# Patient Record
Sex: Female | Born: 1985 | Race: Black or African American | Hispanic: No | Marital: Married | Smoking: Never smoker
Health system: Southern US, Community
[De-identification: ages and names within clinical notes are randomized; demographics above are authoritative.]

## PROBLEM LIST (undated history)

## (undated) DIAGNOSIS — D573 Sickle-cell trait: Secondary | ICD-10-CM

## (undated) DIAGNOSIS — Z809 Family history of malignant neoplasm, unspecified: Secondary | ICD-10-CM

## (undated) DIAGNOSIS — IMO0002 Reserved for concepts with insufficient information to code with codable children: Secondary | ICD-10-CM

## (undated) DIAGNOSIS — Z975 Presence of (intrauterine) contraceptive device: Secondary | ICD-10-CM

## (undated) DIAGNOSIS — B379 Candidiasis, unspecified: Secondary | ICD-10-CM

## (undated) DIAGNOSIS — Z832 Family history of diseases of the blood and blood-forming organs and certain disorders involving the immune mechanism: Secondary | ICD-10-CM

## (undated) DIAGNOSIS — D219 Benign neoplasm of connective and other soft tissue, unspecified: Secondary | ICD-10-CM

## (undated) DIAGNOSIS — Z8619 Personal history of other infectious and parasitic diseases: Secondary | ICD-10-CM

## (undated) DIAGNOSIS — T7840XA Allergy, unspecified, initial encounter: Secondary | ICD-10-CM

## (undated) DIAGNOSIS — A749 Chlamydial infection, unspecified: Secondary | ICD-10-CM

## (undated) HISTORY — DX: Family history of malignant neoplasm, unspecified: Z80.9

## (undated) HISTORY — DX: Personal history of other infectious and parasitic diseases: Z86.19

## (undated) HISTORY — DX: Family history of diseases of the blood and blood-forming organs and certain disorders involving the immune mechanism: Z83.2

## (undated) HISTORY — DX: Sickle-cell trait: D57.3

## (undated) HISTORY — DX: Candidiasis, unspecified: B37.9

## (undated) HISTORY — PX: PULMONARY EMBOLISM SURGERY: SHX752

## (undated) HISTORY — DX: Benign neoplasm of connective and other soft tissue, unspecified: D21.9

## (undated) HISTORY — DX: Reserved for concepts with insufficient information to code with codable children: IMO0002

## (undated) HISTORY — DX: Presence of (intrauterine) contraceptive device: Z97.5

## (undated) HISTORY — PX: NO PAST SURGERIES: SHX2092

## (undated) HISTORY — DX: Allergy, unspecified, initial encounter: T78.40XA

## (undated) HISTORY — DX: Chlamydial infection, unspecified: A74.9

## (undated) HISTORY — PX: WISDOM TOOTH EXTRACTION: SHX21

---
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
## 2010-08-12 DIAGNOSIS — Z975 Presence of (intrauterine) contraceptive device: Secondary | ICD-10-CM | POA: Insufficient documentation

## 2010-08-12 HISTORY — DX: Presence of (intrauterine) contraceptive device: Z97.5

## 2011-08-13 DIAGNOSIS — A749 Chlamydial infection, unspecified: Secondary | ICD-10-CM

## 2011-08-13 HISTORY — DX: Chlamydial infection, unspecified: A74.9

## 2011-12-20 ENCOUNTER — Ambulatory Visit (INDEPENDENT_AMBULATORY_CARE_PROVIDER_SITE_OTHER): Payer: No Typology Code available for payment source | Admitting: Obstetrics and Gynecology

## 2011-12-20 ENCOUNTER — Encounter: Payer: Self-pay | Admitting: Obstetrics and Gynecology

## 2011-12-20 ENCOUNTER — Other Ambulatory Visit: Payer: Self-pay | Admitting: Obstetrics and Gynecology

## 2011-12-20 VITALS — BP 100/68 | HR 86 | Ht 63.0 in | Wt 138.0 lb

## 2011-12-20 DIAGNOSIS — Z30431 Encounter for routine checking of intrauterine contraceptive device: Secondary | ICD-10-CM

## 2011-12-20 DIAGNOSIS — A499 Bacterial infection, unspecified: Secondary | ICD-10-CM

## 2011-12-20 DIAGNOSIS — E010 Iodine-deficiency related diffuse (endemic) goiter: Secondary | ICD-10-CM

## 2011-12-20 DIAGNOSIS — N898 Other specified noninflammatory disorders of vagina: Secondary | ICD-10-CM

## 2011-12-20 DIAGNOSIS — N760 Acute vaginitis: Secondary | ICD-10-CM

## 2011-12-20 DIAGNOSIS — Z124 Encounter for screening for malignant neoplasm of cervix: Secondary | ICD-10-CM

## 2011-12-20 DIAGNOSIS — Z113 Encounter for screening for infections with a predominantly sexual mode of transmission: Secondary | ICD-10-CM

## 2011-12-20 DIAGNOSIS — E049 Nontoxic goiter, unspecified: Secondary | ICD-10-CM

## 2011-12-20 LAB — HEPATITIS C ANTIBODY: HCV Ab: NEGATIVE

## 2011-12-20 LAB — POCT WET PREP (WET MOUNT)
Clue Cells Wet Prep Whiff POC: POSITIVE
pH: 5.5

## 2011-12-20 LAB — HEPATITIS B SURFACE ANTIGEN: Hepatitis B Surface Ag: NEGATIVE

## 2011-12-20 MED ORDER — TINIDAZOLE 500 MG PO TABS: 2.0000 g | ORAL_TABLET | Freq: Every day | ORAL | Status: AC

## 2011-12-20 NOTE — Progress Notes (Addendum)
Last Pap: 2012 per pt  WNL: Yes Regular Periods:no Contraception: mirena  Monthly Breast exam:no Tetanus<73yrs:yes Nl.Bladder Function:yes Daily BMs:yes Healthy Diet:no Calcium:yes Mammogram:no Exercise:no Seatbelt: yes Abuse at home: no Stressful work:no Sigmoid-colonoscopy: n/a  Bone Density: No Pt wants to make sure she does not have a yeast infection. Kim Alvarez  C/o cramping wonders if IUD is displaced.  Also c/o vag irritation.  Filed Vitals:   12/20/11 1037  BP: 100/68  Pulse: 86   ROS: noncontributory  Physical Examination: General appearance - alert, well appearing, and in no distress Neck - supple, thyroid mildly enlarged (R>L) Chest - clear to auscultation, no wheezes, rales or rhonchi, symmetric air entry Heart - normal rate and regular rhythm Abdomen - soft, nontender, nondistended, no masses or organomegaly Breasts - breasts appear normal, no suspicious masses, no skin or nipple changes or axillary nodes Pelvic - normal external genitalia, vulva, vagina, cervix, uterus and adnexa, IUD string visible Back exam - no CVAT Extremities - no edema, redness or tenderness in the calves or thighs  Results for orders placed in visit on 12/20/11  POCT WET PREP (WET MOUNT)      Component Value Range   Source Wet Prep POC vaginal     WBC, Wet Prep HPF POC       Bacteria Wet Prep HPF POC mod     BACTERIA WET PREP MORPHOLOGY POC       Clue Cells Wet Prep HPF POC Moderate     CLUE CELLS WET PREP WHIFF POC Positive Whiff     Yeast Wet Prep HPF POC None     KOH Wet Prep POC       Trichomonas Wet Prep HPF POC none     pH 5.5     A/P sched u/s to eval IUD next available STD testing w/ consent Tindamax for BV Add on TSH to labs and sched thyroid u/s

## 2011-12-20 NOTE — Progress Notes (Signed)
Addended by: Osborn Coho on: 12/20/2011 05:22 PM   Modules accepted: Orders

## 2011-12-23 LAB — HSV 1 ANTIBODY, IGG: HSV 1 Glycoprotein G Ab, IgG: <0.1 IV

## 2011-12-23 LAB — TSH: TSH: 0.399 u[IU]/mL (ref 0.350–4.500)

## 2011-12-25 ENCOUNTER — Telehealth: Payer: Self-pay

## 2011-12-25 LAB — PAP IG, CT-NG, RFX HPV ASCU
Chlamydia Probe Amp: NEGATIVE
GC Probe Amp: NEGATIVE

## 2011-12-25 NOTE — Telephone Encounter (Signed)
LM for pt that thyroid u/s is scheduled for 12/27/2011 @ 2 pm. @ [address] in the [facility]. I asked pt to cb to let me know she got my message. Melody Comas A

## 2011-12-27 ENCOUNTER — Ambulatory Visit
Admission: RE | Admit: 2011-12-27 | Discharge: 2011-12-27 | Disposition: A | Discharge status: Home / Self Care | Payer: PRIVATE HEALTH INSURANCE | Source: Ambulatory Visit | Attending: Obstetrics and Gynecology | Admitting: Obstetrics and Gynecology

## 2011-12-27 DIAGNOSIS — E010 Iodine-deficiency related diffuse (endemic) goiter: Secondary | ICD-10-CM

## 2012-01-02 ENCOUNTER — Telehealth: Payer: Self-pay

## 2012-01-02 NOTE — Telephone Encounter (Signed)
Spoke to pt and notified of thyroid u/s results and the need for endocrinology referral. I will call her w/ appt date and time. Appt set w/ Dr. Chestine Spore for Wed. 01/08/2012 @ 1 pm. Records faxed. Melody Comas A

## 2012-01-02 NOTE — Telephone Encounter (Signed)
Appt set for 01/08/2012 w/ Dr. Chestine Spore @ 1 pm. Pt is aware. Melody Comas A

## 2012-01-03 ENCOUNTER — Encounter: Payer: No Typology Code available for payment source | Admitting: Obstetrics and Gynecology

## 2012-01-03 ENCOUNTER — Other Ambulatory Visit: Payer: No Typology Code available for payment source

## 2012-01-16 ENCOUNTER — Encounter: Payer: No Typology Code available for payment source | Admitting: Obstetrics and Gynecology

## 2012-01-16 ENCOUNTER — Other Ambulatory Visit: Payer: No Typology Code available for payment source

## 2012-01-28 ENCOUNTER — Other Ambulatory Visit: Payer: Self-pay | Admitting: Internal Medicine

## 2012-01-28 DIAGNOSIS — E042 Nontoxic multinodular goiter: Secondary | ICD-10-CM

## 2012-02-03 ENCOUNTER — Encounter: Payer: No Typology Code available for payment source | Admitting: Obstetrics and Gynecology

## 2012-02-04 ENCOUNTER — Encounter (HOSPITAL_COMMUNITY)
Admission: RE | Admit: 2012-02-04 | Discharge: 2012-02-04 | Disposition: A | Discharge status: Home / Self Care | Payer: PRIVATE HEALTH INSURANCE | Source: Ambulatory Visit | Attending: Internal Medicine | Admitting: Internal Medicine

## 2012-02-04 DIAGNOSIS — E042 Nontoxic multinodular goiter: Secondary | ICD-10-CM | POA: Insufficient documentation

## 2012-02-05 ENCOUNTER — Encounter (HOSPITAL_COMMUNITY)
Admission: RE | Admit: 2012-02-05 | Discharge: 2012-02-05 | Disposition: A | Discharge status: Home / Self Care | Payer: PRIVATE HEALTH INSURANCE | Source: Ambulatory Visit | Attending: Internal Medicine | Admitting: Internal Medicine

## 2012-02-05 MED ORDER — SODIUM PERTECHNETATE TC 99M INJECTION
10.0000 | Freq: Once | INTRAVENOUS | Status: AC | PRN
  Administered 2012-02-05: 10 via INTRAVENOUS

## 2012-02-05 MED ORDER — SODIUM IODIDE I 131 CAPSULE
9.3000 | Freq: Once | INTRAVENOUS | Status: AC | PRN
  Administered 2012-02-05: 9.3 via ORAL

## 2012-02-20 ENCOUNTER — Ambulatory Visit (INDEPENDENT_AMBULATORY_CARE_PROVIDER_SITE_OTHER): Payer: No Typology Code available for payment source | Admitting: Obstetrics and Gynecology

## 2012-02-20 ENCOUNTER — Encounter: Payer: Self-pay | Admitting: Obstetrics and Gynecology

## 2012-02-20 ENCOUNTER — Telehealth: Payer: Self-pay | Admitting: Obstetrics and Gynecology

## 2012-02-20 VITALS — BP 100/60 | Temp 98.0°F | Ht 64.0 in | Wt 134.0 lb

## 2012-02-20 DIAGNOSIS — D259 Leiomyoma of uterus, unspecified: Secondary | ICD-10-CM

## 2012-02-20 DIAGNOSIS — R87619 Unspecified abnormal cytological findings in specimens from cervix uteri: Secondary | ICD-10-CM | POA: Insufficient documentation

## 2012-02-20 DIAGNOSIS — D219 Benign neoplasm of connective and other soft tissue, unspecified: Secondary | ICD-10-CM | POA: Insufficient documentation

## 2012-02-20 DIAGNOSIS — IMO0002 Reserved for concepts with insufficient information to code with codable children: Secondary | ICD-10-CM | POA: Insufficient documentation

## 2012-02-20 DIAGNOSIS — B379 Candidiasis, unspecified: Secondary | ICD-10-CM | POA: Insufficient documentation

## 2012-02-20 DIAGNOSIS — R6889 Other general symptoms and signs: Secondary | ICD-10-CM

## 2012-02-20 DIAGNOSIS — B49 Unspecified mycosis: Secondary | ICD-10-CM

## 2012-02-20 LAB — POCT WET PREP (WET MOUNT)
Whiff Test: NEGATIVE
pH: 4.5

## 2012-02-20 MED ORDER — FLUCONAZOLE 150 MG PO TABS: 150.0000 mg | ORAL_TABLET | Freq: Once | ORAL | Status: DC

## 2012-02-20 MED ORDER — TERCONAZOLE 0.4 % VA CREA: 1.0000 | TOPICAL_CREAM | Freq: Every day | VAGINAL | Status: AC

## 2012-02-20 NOTE — Telephone Encounter (Signed)
LM FOR PT TO CALL BACK. 

## 2012-02-20 NOTE — Telephone Encounter (Signed)
TC TO PT REGARDING MESSAGE. PT STATES THAT SHE WOKE UP AT 5:00 AM AND NOTICED A BULGE ON HER LABIA. ? IF IT IS A CYST. SCHEDULE  APPT WITH EP ON TODAY AT 1:30 PM FOR EVAL.  PT VOICED UNDERSTANDING.

## 2012-02-20 NOTE — Progress Notes (Signed)
Color: WHITE Odor: no Itching:no Thin:no Thick:yes Fever:no Dyspareunia:no Hx PID:no HX STD:yes Pelvic Pain:no Desires Gc/CT:no Desires HIV,RPR,HbsAG:no

## 2012-02-20 NOTE — Progress Notes (Signed)
25 YO complains of left vaginal lump since a.m.  Denies pain but it is irritating when she walks.  Has never had this before.  Also complains of vaginal itching.   O: Pelvic: EGBUS-wnl (patient looked at area and was not able to see the swollen area she mentioned), vagina-large amount of clumpy yellow discharge,     cervix-normal, uterus-normal, adnexae-normal     Wet Prep: pH- 4.5, whiff-negative,  large yeast   A: Candida Vaginitis  P: Diflucan 150 mg #1 1 po stat          RTO- AEx or prn  Abdur Hoglund, PA-C

## 2012-06-15 NOTE — Telephone Encounter (Signed)
Chart note to close encounter. Kim Alvarez A

## 2012-10-11 ENCOUNTER — Encounter (HOSPITAL_COMMUNITY): Payer: Self-pay | Admitting: *Deleted

## 2012-10-11 ENCOUNTER — Emergency Department (INDEPENDENT_AMBULATORY_CARE_PROVIDER_SITE_OTHER)
Admission: EM | Admit: 2012-10-11 | Discharge: 2012-10-11 | Disposition: A | Discharge status: Home / Self Care | Payer: PRIVATE HEALTH INSURANCE | Source: Home / Self Care | Attending: Family Medicine | Admitting: Family Medicine

## 2012-10-11 DIAGNOSIS — R220 Localized swelling, mass and lump, head: Secondary | ICD-10-CM

## 2012-10-11 MED ORDER — DOXYCYCLINE HYCLATE 100 MG PO CAPS: 100.0000 mg | ORAL_CAPSULE | Freq: Two times a day (BID) | ORAL | Status: DC

## 2012-10-11 NOTE — ED Notes (Signed)
Patient complains of large painful lump on right side of neck x 5 days. Denies fever/chills.

## 2012-10-11 NOTE — ED Provider Notes (Signed)
History     [name]: [id number]  Arrival date & time 10/11/12  1155   First MD Initiated Contact with Patient 10/11/12 1220      Chief Complaint  Patient presents with  . Recurrent Skin Infections    (Consider location/radiation/quality/duration/timing/severity/associated sxs/prior treatment) Patient is a 27 y.o. female presenting with abscess. The history is provided by the patient.  Abscess Location:  Head/neck Abscess quality: painful and redness   Red streaking: no   Duration:  5 days Progression:  Worsening Pain details:    Severity:  Mild   Progression:  Worsening Chronicity:  New   Past Medical History  Diagnosis Date  . Abnormal Pap smear 2007  . Yeast infection   . Chlamydia   . Fibroids 2011     Fibroids during pregnancy  . H/O varicella   . Family history of sickle cell trait   . FHx: cancer     History reviewed. No pertinent past surgical history.  Family History  Problem Relation Age of Onset  . Sickle cell anemia Mother   . Sickle cell anemia Maternal Aunt   . Breast cancer Paternal Aunt   . Sickle cell anemia Paternal Aunt   . Sickle cell anemia Maternal Uncle     History  Substance Use Topics  . Smoking status: Never Smoker   . Smokeless tobacco: Never Used  . Alcohol Use: Yes     Comment: socially     OB History   Grav Para Term Preterm Abortions TAB SAB Ect Mult Living   2 1   1     1       Review of Systems  Constitutional: Negative.   HENT: Positive for neck pain.   Skin: Positive for rash.    Allergies  Review of patient's allergies indicates no known allergies.  Home Medications   Current Outpatient Rx  Name  Route  Sig  Dispense  Refill  . doxycycline (VIBRAMYCIN) 100 MG capsule   Oral   Take 1 capsule (100 mg total) by mouth 2 (two) times daily.   20 capsule   0   . levonorgestrel (MIRENA) 20 MCG/24HR IUD   Intrauterine   1 each by Intrauterine route once.           BP 110/76  Pulse 97  Temp(Src) 98.2 F  (36.8 C) (Oral)  Resp 18  SpO2 99%  LMP 09/26/2012  Physical Exam  Nursing note and vitals reviewed. Constitutional: She is oriented to person, place, and time. She appears well-developed and well-nourished. No distress.  Neck: Normal range of motion. Neck supple.  Lymphadenopathy:    She has no cervical adenopathy.  Neurological: She is alert and oriented to person, place, and time.  Skin: Skin is warm and dry. Rash noted.  Pustular erythematous nodule right neck. Nearly draining, approx 4mm in size.    ED Course  Procedures (including critical care time)  Labs Reviewed - No data to display No results found.   1. Lump on neck       MDM          Linna Hoff, MD 10/11/12 1351

## 2014-01-21 ENCOUNTER — Encounter: Payer: Self-pay | Admitting: Gynecology

## 2014-01-26 ENCOUNTER — Encounter: Payer: Self-pay | Admitting: Gynecology

## 2014-01-26 ENCOUNTER — Ambulatory Visit (INDEPENDENT_AMBULATORY_CARE_PROVIDER_SITE_OTHER): Payer: BC Managed Care – PPO | Admitting: Gynecology

## 2014-01-26 VITALS — BP 104/66 | Resp 18 | Ht 64.0 in | Wt 141.0 lb

## 2014-01-26 DIAGNOSIS — D573 Sickle-cell trait: Secondary | ICD-10-CM | POA: Insufficient documentation

## 2014-01-26 DIAGNOSIS — A749 Chlamydial infection, unspecified: Secondary | ICD-10-CM | POA: Insufficient documentation

## 2014-01-26 DIAGNOSIS — Z124 Encounter for screening for malignant neoplasm of cervix: Secondary | ICD-10-CM

## 2014-01-26 DIAGNOSIS — Z975 Presence of (intrauterine) contraceptive device: Secondary | ICD-10-CM

## 2014-01-26 DIAGNOSIS — Z113 Encounter for screening for infections with a predominantly sexual mode of transmission: Secondary | ICD-10-CM

## 2014-01-26 DIAGNOSIS — E042 Nontoxic multinodular goiter: Secondary | ICD-10-CM

## 2014-01-26 DIAGNOSIS — Z01419 Encounter for gynecological examination (general) (routine) without abnormal findings: Secondary | ICD-10-CM

## 2014-01-26 LAB — TSH: TSH: 0.337 u[IU]/mL — AB (ref 0.350–4.500)

## 2014-01-26 NOTE — Progress Notes (Signed)
28 y.o. Single African American female   [phone] here for annual exam. Pt is  currently sexually active.  She reports not using condoms on a regular basis.  First sexual activity at 29 years old, 10 number of lifetime partners.   Pt with mirena placed 08/2010, monthly cycles light. Pt reports thyroid is enlarged and was given medication by Dr. Eligah East but did not take as she had no symptoms, now recently she has noticed her neck is enlarged.  No change in weight, bowels.   Pt interested in STD screening, current partner 51m, intermittent condom use.  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 Pt had a 26w premie in [place], [place], presented FD to hospital. History of fibroids  Patient's last menstrual period was 01/19/2014.          Sexually active: yes  The current method of family planning is IUD.    Exercising: no  The patient does not participate in regular exercise at present. Last pap: 2013- not sure if normal  Abnormal: yes when she was in a teenager  Alcohol: socially Tobacco: no Drugs: no Gardisil: no, completed:    Health Maintenance  Topic Date Due  . Tetanus/tdap  06/17/2006  . Influenza Vaccine  03/12/2014  . Pap Smear  12/20/2014    Family History  Problem Relation Age of Onset  . Sickle cell anemia Mother     alive  . Sickle cell anemia Maternal Aunt     alive  . Breast cancer Paternal Aunt   . Sickle cell anemia Maternal Uncle     alive  . Sickle cell anemia Maternal Aunt     died  . Sickle cell anemia Maternal Uncle     died    Patient Active Problem List   Diagnosis Date Noted  . Sickle cell trait   . Chlamydia   . Abnormal Pap smear   . Yeast infection   . Fibroids   . IUD (intrauterine device) in place 08/12/2010    Past Medical History  Diagnosis Date  . Abnormal Pap smear 2007  . Yeast infection   . Chlamydia 2013    treated  . H/O varicella   . FHx: cancer   . IUD (intrauterine device) in place  08/2010  . Fibroids 2011     Fibroids during pregnancy  . Sickle cell trait   . Family history of sickle cell anemia (Hgb SS) in mother     History reviewed. No pertinent past surgical history.  Allergies: Review of patient's allergies indicates no known allergies.  Current Outpatient Prescriptions  Medication Sig Dispense Refill  . levonorgestrel (MIRENA) 20 MCG/24HR IUD 1 each by Intrauterine route once.       No current facility-administered medications for this visit.    ROS: Pertinent items are noted in HPI.  Exam:    BP 104/66  Resp 18  Ht 5\' 4"  (1.626 m)  Wt 141 lb (63.957 kg)  BMI 24.19 kg/m2  LMP 01/19/2014 Weight change: @WEIGHTCHANGE @ Last 3 height recordings:  Ht Readings from Last 3 Encounters:  01/26/14 5\' 4"  (1.626 m)  02/20/12 5\' 4"  (1.626 m)  12/20/11 5\' 3"  (1.6 m)   General appearance: alert, cooperative and appears stated age Head: Normocephalic, without obvious abnormality, atraumatic Neck: no adenopathy, no carotid bruit, no JVD, supple, symmetrical, trachea midline and  Thyroid: irregular, mobile non-tender Lungs: clear to auscultation bilaterally Breasts: normal appearance, no masses or  tenderness Heart: regular rate and rhythm, S1, S2 normal, no murmur, click, rub or gallop Abdomen: soft, non-tender; bowel sounds normal; no masses,  no organomegaly Extremities: extremities normal, atraumatic, no cyanosis or edema Skin: Skin color, texture, turgor normal. No rashes or lesions Lymph nodes: Cervical, supraclavicular, and axillary nodes normal. no inguinal nodes palpated Neurologic: Grossly normal   Pelvic: External genitalia:  no lesions              Urethra: normal appearing urethra with no masses, tenderness or lesions              Bartholins and Skenes: Bartholin's, Urethra, Skene's normal                 Vagina: normal appearing vagina with normal color and discharge, no lesions              Cervix: normal appearance and IUD string  visualized              Pap taken: yes        Bimanual Exam:  Uterus:  enlarged to 8 week's size, irregular, right 3cm anterior, posterior noted                                      Adnexa:    no masses                                      Rectovaginal: Confirms                                      Anus:  normal sphincter tone, no lesions  1. Routine gynecological examination counseled on breast self exam, STD prevention, feminine hygiene, adequate intake of calcium and vitamin D, diet and exercise return annually or prn Discussed STD prevention, regular condom use.   2. Sickle cell trait  3. IUD (intrauterine device) in place   4. Chlamydia  - N. gonorrhoea and Chlamydia by PCR (IPS)  5. Goiter, nontoxic, multinodular Reviewed prior u/s will get labs and repeat  U/s and refer back to Dr. Carlis Abbott - TSH - US Soft Tissue Head/Neck; Future  6. Screening for cervical cancer  - PAP with Reflex to HPV (IPS)  7. 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 gonorrhoea and Chlamydia by PCR (IPS)      An After Visit Summary was printed and given to the patient.

## 2014-01-27 ENCOUNTER — Telehealth: Payer: Self-pay | Admitting: *Deleted

## 2014-01-27 LAB — RPR

## 2014-01-27 LAB — HIV ANTIBODY (ROUTINE TESTING W REFLEX): HIV 1&2 Ab, 4th Generation: NONREACTIVE

## 2014-01-27 NOTE — Telephone Encounter (Signed)
Message copied by Alfonzo Feller on Thu Jan 27, 2014  2:32 PM ------      Message from: Elveria Rising      Created: Thu Jan 27, 2014 11:10 AM       Inform thyroid mildly overstimulated, should f/u thyroid u/s ------

## 2014-01-27 NOTE — Telephone Encounter (Signed)
Left Message To Call Back  

## 2014-01-27 NOTE — Telephone Encounter (Signed)
Patient notified see result note 

## 2014-01-28 ENCOUNTER — Encounter: Payer: Self-pay | Admitting: Gynecology

## 2014-01-28 ENCOUNTER — Telehealth: Payer: Self-pay | Admitting: *Deleted

## 2014-01-28 ENCOUNTER — Ambulatory Visit (INDEPENDENT_AMBULATORY_CARE_PROVIDER_SITE_OTHER): Payer: BC Managed Care – PPO | Admitting: Gynecology

## 2014-01-28 VITALS — BP 104/70 | HR 88 | Resp 16 | Ht 64.0 in | Wt 140.0 lb

## 2014-01-28 DIAGNOSIS — B9689 Other specified bacterial agents as the cause of diseases classified elsewhere: Secondary | ICD-10-CM

## 2014-01-28 DIAGNOSIS — B379 Candidiasis, unspecified: Secondary | ICD-10-CM

## 2014-01-28 DIAGNOSIS — N72 Inflammatory disease of cervix uteri: Secondary | ICD-10-CM

## 2014-01-28 DIAGNOSIS — N760 Acute vaginitis: Secondary | ICD-10-CM

## 2014-01-28 DIAGNOSIS — A499 Bacterial infection, unspecified: Secondary | ICD-10-CM

## 2014-01-28 DIAGNOSIS — A562 Chlamydial infection of genitourinary tract, unspecified: Secondary | ICD-10-CM

## 2014-01-28 LAB — CBC WITH DIFFERENTIAL/PLATELET
BASOS PCT: 0 % (ref 0–1)
Basophils Absolute: 0 10*3/uL (ref 0.0–0.1)
Eosinophils Absolute: 0.2 10*3/uL (ref 0.0–0.7)
Eosinophils Relative: 4 % (ref 0–5)
HEMATOCRIT: 39.2 % (ref 36.0–46.0)
HEMOGLOBIN: 13.1 g/dL (ref 12.0–15.0)
LYMPHS ABS: 2.2 10*3/uL (ref 0.7–4.0)
LYMPHS PCT: 36 % (ref 12–46)
MCH: 26.7 pg (ref 26.0–34.0)
MCHC: 33.4 g/dL (ref 30.0–36.0)
MCV: 79.8 fL (ref 78.0–100.0)
MONO ABS: 0.5 10*3/uL (ref 0.1–1.0)
MONOS PCT: 8 % (ref 3–12)
NEUTROS ABS: 3.1 10*3/uL (ref 1.7–7.7)
NEUTROS PCT: 52 % (ref 43–77)
Platelets: 264 10*3/uL (ref 150–400)
RBC: 4.91 MIL/uL (ref 3.87–5.11)
RDW: 13.6 % (ref 11.5–15.5)
WBC: 6 10*3/uL (ref 4.0–10.5)

## 2014-01-28 LAB — IPS N GONORRHOEA AND CHLAMYDIA BY PCR

## 2014-01-28 LAB — IPS PAP TEST WITH REFLEX TO HPV

## 2014-01-28 MED ORDER — TINIDAZOLE 500 MG PO TABS: 2.0000 g | ORAL_TABLET | Freq: Once | ORAL | Status: DC

## 2014-01-28 MED ORDER — CEFTRIAXONE SODIUM 250 MG IJ SOLR
250.0000 mg | Freq: Once | INTRAMUSCULAR | Status: AC
Start: 2014-01-28 — End: 2014-01-28
  Administered 2014-01-28: 250 mg via INTRAMUSCULAR

## 2014-01-28 MED ORDER — FLUCONAZOLE 150 MG PO TABS: 150.0000 mg | ORAL_TABLET | Freq: Once | ORAL | Status: DC

## 2014-01-28 MED ORDER — AZITHROMYCIN 500 MG PO TABS: 1000.0000 mg | ORAL_TABLET | Freq: Once | ORAL | Status: DC

## 2014-01-28 NOTE — Telephone Encounter (Signed)
Message copied by Jaymes Graff on Fri Jan 28, 2014 11:57 AM ------      Message from: Elveria Rising      Created: Fri Jan 28, 2014 11:51 AM       pls inform +CTM, had in past but no test of cure done, she has IUD In place, she will need a consult to determine if this is her best form of contraception, in addition she has both BV and yeast on PAP.  I would treat all 3 + rocephin, please see if we can get her in now ------

## 2014-01-28 NOTE — Progress Notes (Signed)
Pt asked to come in to office today for treatment of CTM infection.  In addition, pt's pap reported BV and Yeast.  This is the patients second CTM infection.  Different partner.  She was treated at HD with doxycycline but did not return for test of cure.  Pt believes that her partner was also treated. Pt has IUD in place.  ROS: Review of Systems - General ROS: negative for - chills, fatigue or fever Genito-Urinary ROS: no dysuria, trouble voiding, or hematuria negative for - change in menstrual cycle, change in urinary stream, dysmenorrhea, dyspareunia, dysuria, genital discharge, genital ulcers or pelvic pain  BP 104/70  Pulse 88  Resp 16  Ht 5\' 4"  (1.626 m)  Wt 140 lb (63.504 kg)  BMI 24.02 kg/m2  LMP 01/19/2014 General appearance: alert, cooperative and appears stated age Abdomen: soft, non-tender; bowel sounds normal; no masses,  no organomegaly Lymph nodes: Inguinal adenopathy: negative  Pelvic: External genitalia:  no lesions              Urethra:  normal appearing urethra with no masses, tenderness or lesions              Bartholins and Skenes: normal                 Vagina: normal appearing vagina with normal color and discharge, no lesions              Cervix: normal appearance, IUD strings noted, no CMT                      Bimanual Exam:  Uterus:  Irregular, nontender                                      Adnexa: normal adnexa in size, nontender and no masses  Assessment: +chlamydia infection BV Yeast IUD in place  Plan: Pt treated in past for CTM with doxycycline, unclear if reinfection vs incomplete cure, will treat with zithromax Rocephin now IM tindamax-alcohol precautions given Fluconazole Discussed with pt risks of PID with recurrent infection/persistent infection. Signs and symptoms reviewed with pt for PID Discussed alternative forms of contraception such as implanon as she is not monogamous, importance of routine use of condoms rto 1w for recheck Cbc with  diff  76m spent counseling, >50% face to face

## 2014-01-28 NOTE — Telephone Encounter (Signed)
Call to patient and advised Dr Charlies Constable would like to see her today to discuss results. Patient declines and asks if it is important.  Advised of positive tests results for STD chl, BV and trich and Dr Charlies Constable would like to discuss treatment as well as discuss IUD concerns with positive STD. Patient agreeable to come on in.     Routing to Geanna Divirgilio for final review. Patient agreeable to disposition. Will close encounter

## 2014-02-01 ENCOUNTER — Ambulatory Visit (INDEPENDENT_AMBULATORY_CARE_PROVIDER_SITE_OTHER): Payer: BC Managed Care – PPO | Admitting: Gynecology

## 2014-02-01 ENCOUNTER — Telehealth: Payer: Self-pay | Admitting: Gynecology

## 2014-02-01 VITALS — BP 100/66 | Resp 14 | Ht 64.0 in | Wt 143.0 lb

## 2014-02-01 DIAGNOSIS — A562 Chlamydial infection of genitourinary tract, unspecified: Secondary | ICD-10-CM

## 2014-02-01 NOTE — Telephone Encounter (Signed)
Voicemail confirmed patient mobile #. Left message advising patient that she is scheduled at [facility], [place] on [place], June 24 @11am . Also provided their telephone # [phone] and ours if she has any questions/concerns.

## 2014-02-01 NOTE — Progress Notes (Signed)
Subjective:     Patient ID: Kim Alvarez, female   DOB: November 20, 1985, 28 y.o.   MRN: [id number]  HPI Comments: Pt here for f/uy after diagnosed with +CTM, BV and yeast with IUD in place.  Treated with zithromycin, rocephin, tindamax and fluconazole.  Pt states she tolerated medications well.  Informed that HD was notified and partner needs to be treated.  Pt with prior CTM infection without test of cure, concerns re reinfection vs resistant bacteria discussed.    Review of Systems  Constitutional: Negative for fever, chills, diaphoresis and fatigue.  Genitourinary: Negative for dysuria, vaginal bleeding, vaginal discharge and vaginal pain.       Objective:   Physical Exam  Nursing note and vitals reviewed. Constitutional: She is oriented to person, place, and time. She appears well-developed and well-nourished.  Neurological: She is alert and oriented to person, place, and time.  Skin: Skin is warm and dry.   Pelvic: External genitalia:  no lesions              Urethra:  normal appearing urethra with no masses, tenderness or lesions              Bartholins and Skenes: normal                 Vagina: normal appearing vagina with normal color and discharge, no lesions              Cervix: normal appearance, no discharge, no cervical motion tenderness, iud strings noted                      Bimanual Exam:  Uterus:  uterus is normal size, shape, consistency and nontender                                      Adnexa: normal adnexa in size, nontender and no masses                                        Assessment:     +CTM infection with IUD     Plan:     Labs reviewed, normal cbc with wbc  boyfriend to be treated without testing Reviewed reportable infections Test of cure in 3w Questions addressed

## 2014-02-02 ENCOUNTER — Other Ambulatory Visit: Payer: PRIVATE HEALTH INSURANCE

## 2014-02-14 ENCOUNTER — Inpatient Hospital Stay: Admission: RE | Admit: 2014-02-14 | Payer: PRIVATE HEALTH INSURANCE | Source: Ambulatory Visit

## 2014-04-23 IMAGING — US US SOFT TISSUE HEAD/NECK
1 series · 14 of 25 positions shown · non-contrast
Comparison: None.

CLINICAL DATA: Enlarged thyroid gland.

THYROID ULTRASOUND
TECHNIQUE: Ultrasound examination of the thyroid gland and adjacent
soft tissues was performed.

[Series 1: us soft tissue head/neck · 0.09mm/px · 14 of 80 slices shown]
[im 1/80]
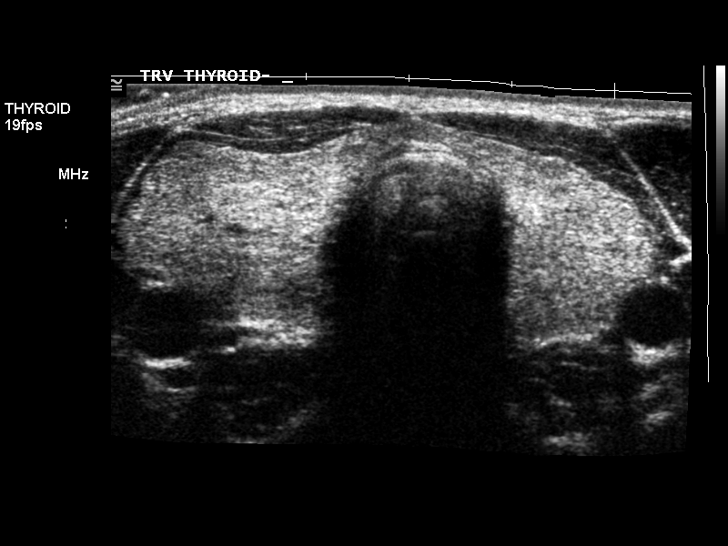
[im 7/80]
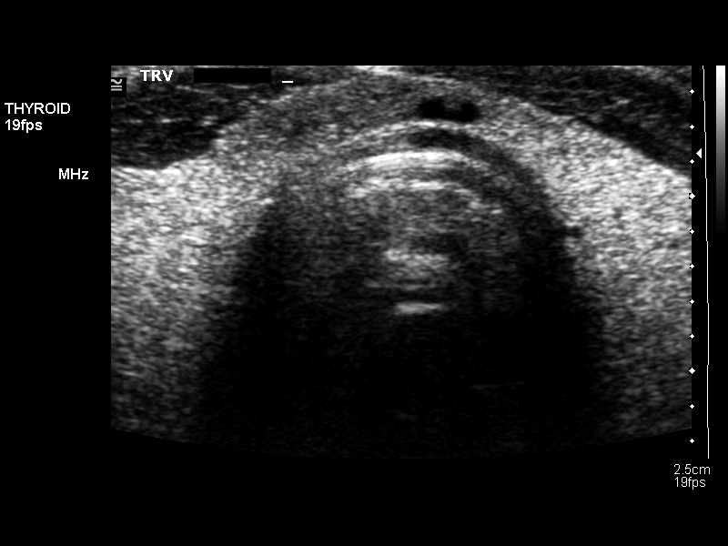
[im 14/80]
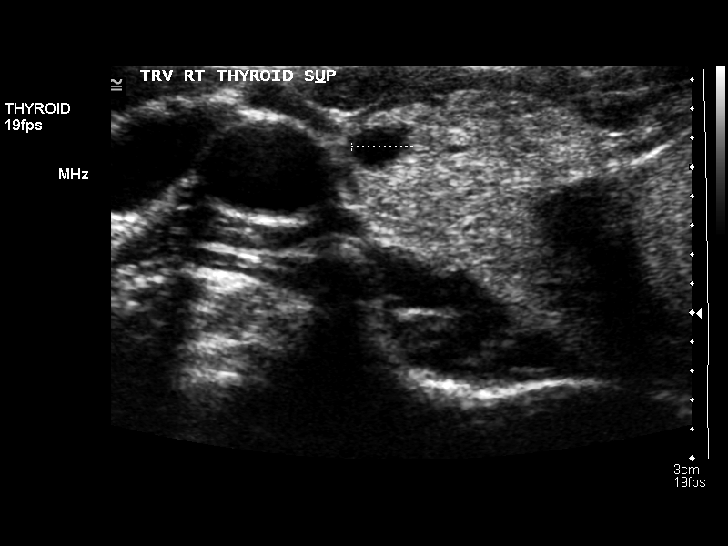
[im 20/80]
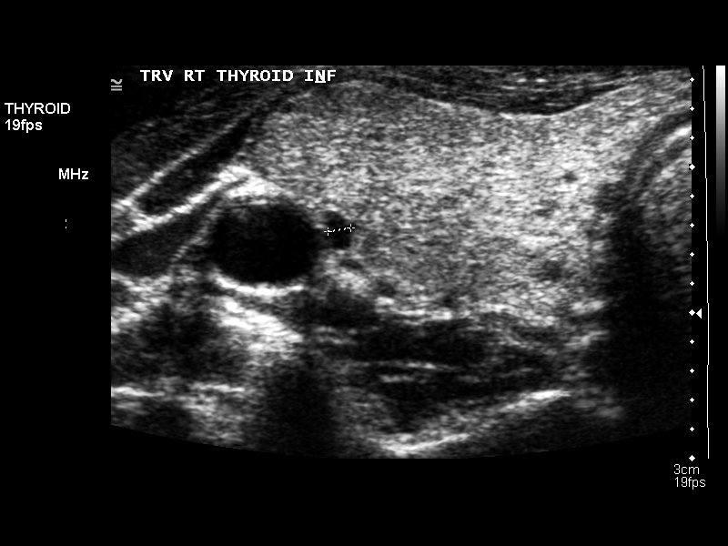
[im 27/80]
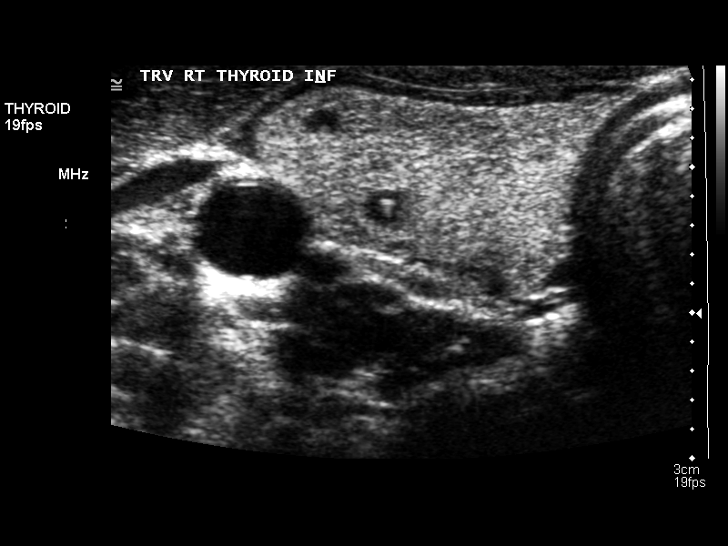
[im 30/80]
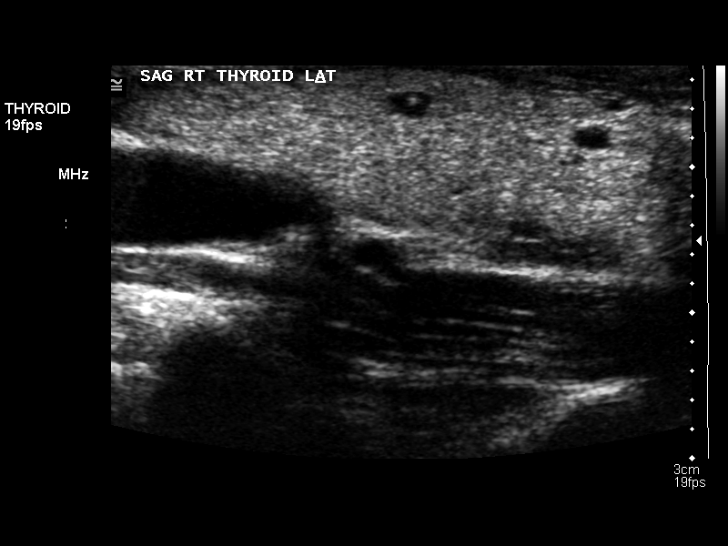
[im 37/80]
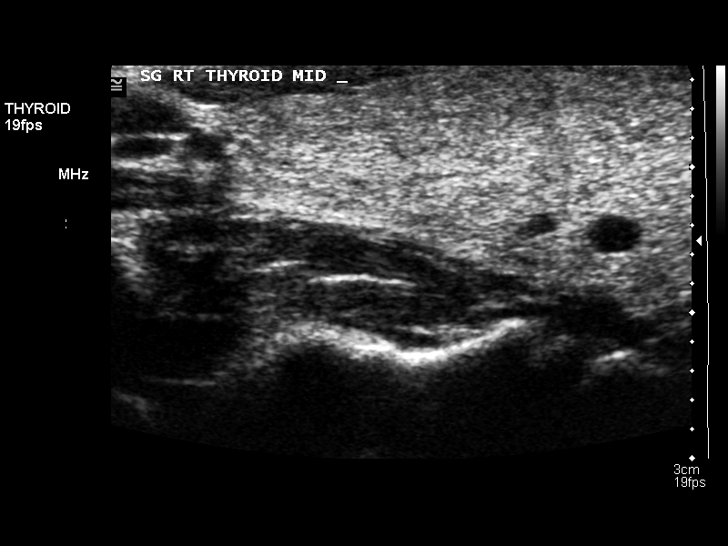
[im 43/80]
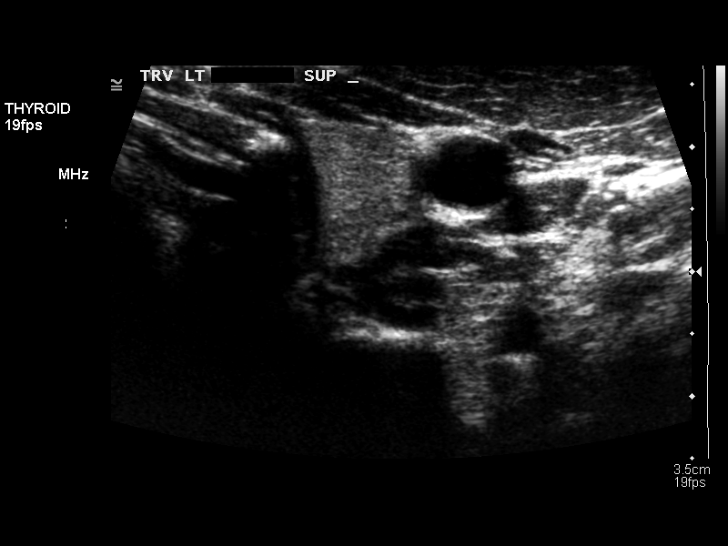
[im 50/80]
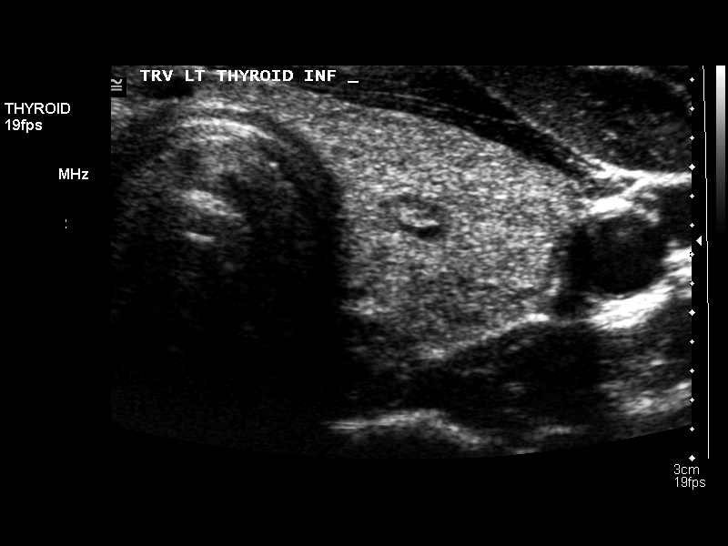
[im 53/80]
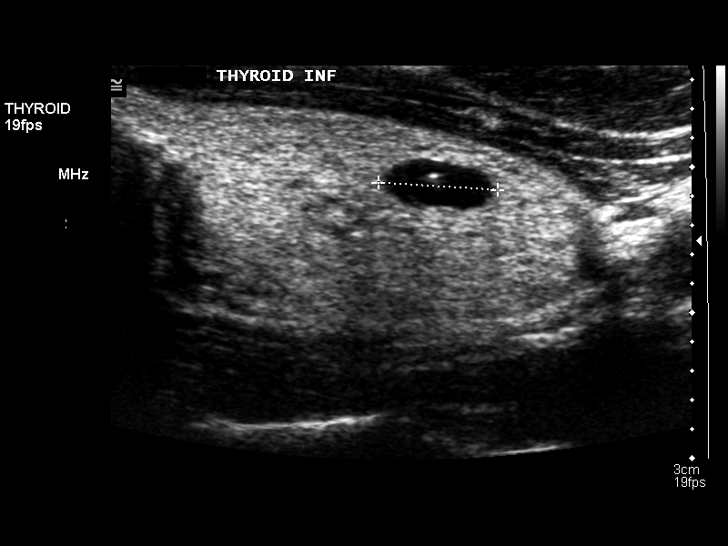
[im 60/80]
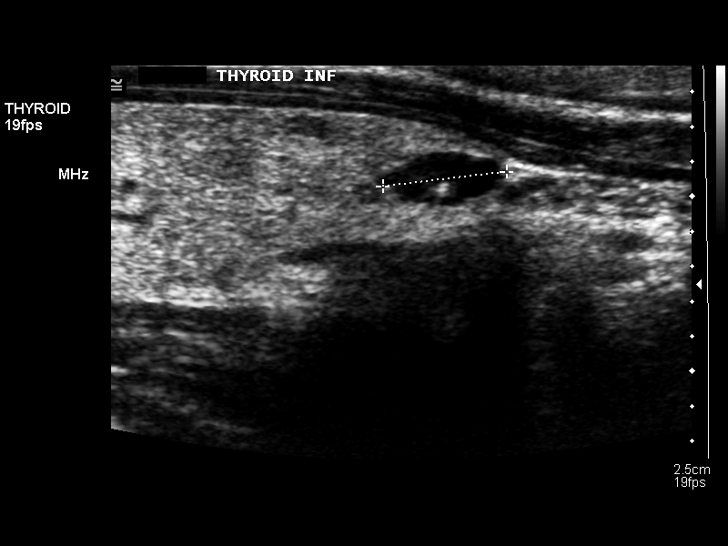
[im 66/80]
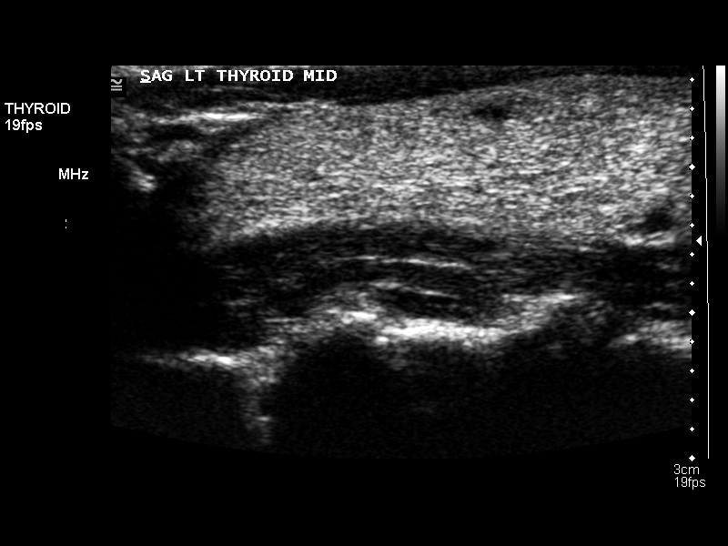
[im 73/80]
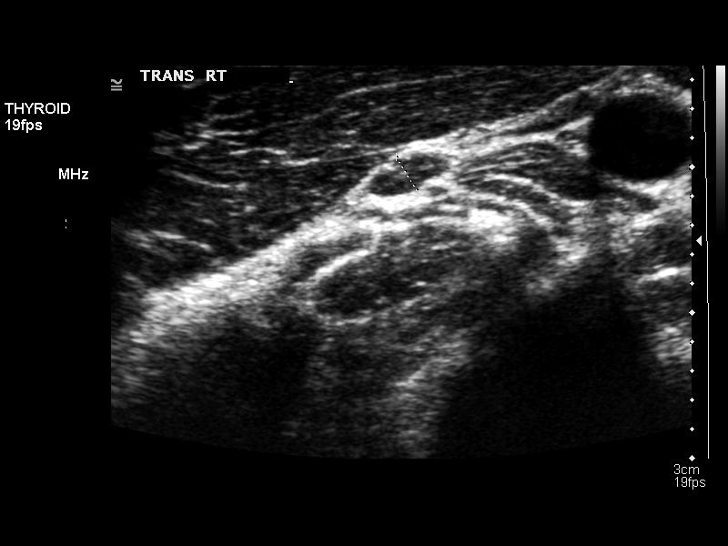
[im 80/80]
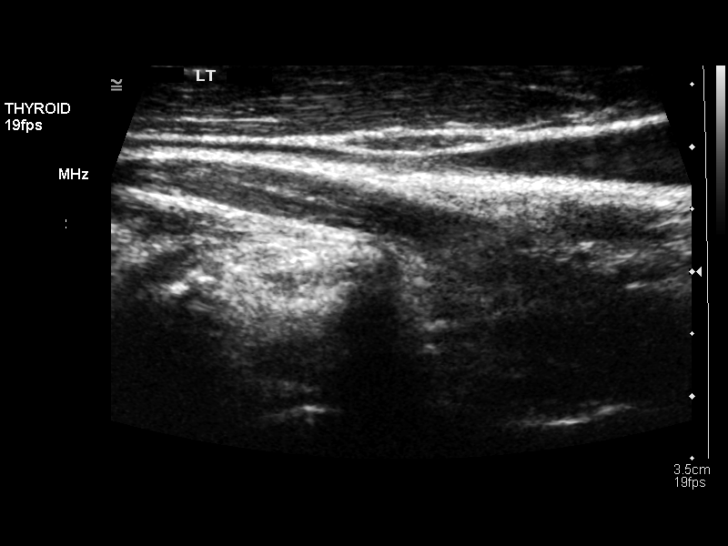

[14 of 25 positions shown; findings below may reference images not displayed]

FINDINGS: Right thyroid lobe:  Measures 5.8 x 1.5 x 2.2 cm.
Left thyroid lobe:  Measures 6.2 x 1.6 x 1.8 cm.
Isthmus:  Measures 0.3 cm.

Heterogeneous thyroid echotexture.

Focal nodules:  There are numerous small, sub centimeter, thyroid
nodules in both lobes. No worrisome dominant solid nodule.

Lymphadenopathy:  None visualized.
IMPRESSION: Multinodular thyroid goiter.

## 2014-06-13 ENCOUNTER — Encounter: Payer: Self-pay | Admitting: Gynecology

## 2014-09-22 ENCOUNTER — Encounter (HOSPITAL_COMMUNITY): Payer: Self-pay | Admitting: *Deleted

## 2014-09-22 ENCOUNTER — Other Ambulatory Visit (HOSPITAL_COMMUNITY)
Admission: RE | Admit: 2014-09-22 | Discharge: 2014-09-22 | Disposition: A | Discharge status: Home / Self Care | Payer: BC Managed Care – PPO | Source: Ambulatory Visit | Attending: Family Medicine | Admitting: Family Medicine

## 2014-09-22 ENCOUNTER — Emergency Department (HOSPITAL_COMMUNITY)
Admission: EM | Admit: 2014-09-22 | Discharge: 2014-09-22 | Disposition: A | Discharge status: Home / Self Care | Payer: BC Managed Care – PPO | Source: Home / Self Care | Attending: Family Medicine | Admitting: Family Medicine

## 2014-09-22 ENCOUNTER — Inpatient Hospital Stay (HOSPITAL_COMMUNITY)
Admission: AD | Admit: 2014-09-22 | Discharge: 2014-09-22 | Discharge status: Against Medical Advice | Payer: PRIVATE HEALTH INSURANCE | Attending: Obstetrics & Gynecology | Admitting: Obstetrics & Gynecology

## 2014-09-22 DIAGNOSIS — Z113 Encounter for screening for infections with a predominantly sexual mode of transmission: Secondary | ICD-10-CM | POA: Diagnosis not present

## 2014-09-22 DIAGNOSIS — N390 Urinary tract infection, site not specified: Secondary | ICD-10-CM

## 2014-09-22 DIAGNOSIS — Z5321 Procedure and treatment not carried out due to patient leaving prior to being seen by health care provider: Secondary | ICD-10-CM | POA: Insufficient documentation

## 2014-09-22 DIAGNOSIS — N760 Acute vaginitis: Secondary | ICD-10-CM | POA: Diagnosis not present

## 2014-09-22 LAB — POCT URINALYSIS DIP (DEVICE)
Bilirubin Urine: NEGATIVE
Glucose, UA: NEGATIVE mg/dL
Ketones, ur: NEGATIVE mg/dL
Nitrite: POSITIVE — AB
PROTEIN: 30 mg/dL — AB
Specific Gravity, Urine: 1.02 (ref 1.005–1.030)
UROBILINOGEN UA: 0.2 mg/dL (ref 0.0–1.0)
pH: 5.5 (ref 5.0–8.0)

## 2014-09-22 LAB — POCT PREGNANCY, URINE: PREG TEST UR: NEGATIVE

## 2014-09-22 MED ORDER — METRONIDAZOLE 500 MG PO TABS: 500.0000 mg | ORAL_TABLET | Freq: Two times a day (BID) | ORAL | Status: DC

## 2014-09-22 MED ORDER — AZITHROMYCIN 250 MG PO TABS
1000.0000 mg | ORAL_TABLET | Freq: Once | ORAL | Status: AC
  Administered 2014-09-22: 1000 mg via ORAL

## 2014-09-22 MED ORDER — CEFTRIAXONE SODIUM 250 MG IJ SOLR
250.0000 mg | Freq: Once | INTRAMUSCULAR | Status: AC
  Administered 2014-09-22: 250 mg via INTRAMUSCULAR

## 2014-09-22 MED ORDER — AZITHROMYCIN 250 MG PO TABS
ORAL_TABLET | ORAL | Status: AC
  Filled 2014-09-22: qty 4

## 2014-09-22 MED ORDER — LIDOCAINE HCL (PF) 1 % IJ SOLN
INTRAMUSCULAR | Status: AC
  Filled 2014-09-22: qty 5

## 2014-09-22 MED ORDER — CEPHALEXIN 500 MG PO CAPS: 500.0000 mg | ORAL_CAPSULE | Freq: Four times a day (QID) | ORAL | Status: DC

## 2014-09-22 MED ORDER — CEFTRIAXONE SODIUM 250 MG IJ SOLR
INTRAMUSCULAR | Status: AC
  Filled 2014-09-22: qty 250

## 2014-09-22 NOTE — ED Notes (Signed)
C/o pain in cervix, feels like IUD is coming out.  She put her finger up there and felt the strings are sticking her on her L side.  Has had a little red or dark colored discharge- small amount after she urinates.  C/o urgency and frequency of urination for 4 days.  LMP 2/4.

## 2014-09-22 NOTE — ED Provider Notes (Signed)
[name]: [id number]     Arrival date & time 09/22/14  1859 History   First MD Initiated Contact with Patient 09/22/14 1925     Chief Complaint  Patient presents with  . Vaginitis   (Consider location/radiation/quality/duration/timing/severity/associated sxs/prior Treatment) Patient is a 29 y.o. female presenting with STD exposure. The history is provided by the patient.  Exposure to STD This is a new (sx of iud discomfort , which has been in since 2012.) problem. The current episode started more than 2 days ago. The problem has not changed since onset.Pertinent negatives include no chest pain and no abdominal pain.    Past Medical History  Diagnosis Date  . Abnormal Pap smear 2007  . Yeast infection   . Chlamydia 2013    treated  . H/O varicella   . FHx: cancer   . IUD (intrauterine device) in place 08/2010  . Fibroids 2011     Fibroids during pregnancy  . Sickle cell trait   . Family history of sickle cell anemia (Hgb SS) in mother   . Goiter, nontoxic, multinodular 2013   History reviewed. No pertinent past surgical history. Family History  Problem Relation Age of Onset  . Sickle cell anemia Mother     alive  . Sickle cell anemia Maternal Aunt     alive  . Breast cancer Paternal Aunt   . Sickle cell anemia Maternal Uncle     alive  . Sickle cell anemia Maternal Aunt     died  . Sickle cell anemia Maternal Uncle     died   History  Substance Use Topics  . Smoking status: Never Smoker   . Smokeless tobacco: Never Used  . Alcohol Use: Yes     Comment: socially    OB History    Gravida Para Term Preterm AB TAB SAB Ectopic Multiple Living   2 1  1 1     1      Review of Systems  Constitutional: Negative.   Cardiovascular: Negative for chest pain.  Gastrointestinal: Negative.  Negative for abdominal pain.  Genitourinary: Positive for dysuria, urgency and vaginal discharge. Negative for vaginal bleeding.    Allergies  Review of patient's allergies indicates no  known allergies.  Home Medications   Prior to Admission medications   Medication Sig Start Date End Date Taking? Authorizing Provider  azithromycin (ZITHROMAX) 500 MG tablet Take 2 tablets (1,000 mg total) by mouth once. 01/28/14   Elveria Rising, MD  cephALEXin (KEFLEX) 500 MG capsule Take 1 capsule (500 mg total) by mouth 4 (four) times daily. Take all of medicine and drink lots of fluids 09/22/14   Billy Fischer, MD  cetirizine (ZYRTEC) 10 MG tablet Take 10 mg by mouth daily.    Historical Provider, MD  fluconazole (DIFLUCAN) 150 MG tablet Take 1 tablet (150 mg total) by mouth once. Take one tablet.  Repeat in 48 hours if symptoms are not completely resolved. 01/28/14   Elveria Rising, MD  levonorgestrel (MIRENA) 20 MCG/24HR IUD 1 each by Intrauterine route once.    Historical Provider, MD  metroNIDAZOLE (FLAGYL) 500 MG tablet Take 1 tablet (500 mg total) by mouth 2 (two) times daily. 09/22/14   Billy Fischer, MD  tinidazole (TINDAMAX) 500 MG tablet Take 4 tablets (2,000 mg total) by mouth once. REPEAT 1D 01/28/14   Elveria Rising, MD   BP 109/74 mmHg  Pulse 80  Temp(Src) 98.5 F (36.9 C) (Oral)  Resp 14  SpO2 100%  LMP  09/13/2014 Physical Exam  Constitutional: She is oriented to person, place, and time. She appears well-developed and well-nourished. No distress.  Abdominal: Soft. Bowel sounds are normal. She exhibits no distension and no mass. There is no tenderness. There is no rebound and no guarding.  Genitourinary: Uterus normal. Vaginal discharge found.  iud string in place , no fb or evidence of iud coming out, there is a copious yellow vag d/c. No odor.  Neurological: She is alert and oriented to person, place, and time.  Skin: Skin is warm and dry.  Nursing note and vitals reviewed.   ED Course  Procedures (including critical care time) Labs Review Labs Reviewed  POCT URINALYSIS DIP (DEVICE) - Abnormal; Notable for the following:    Hgb urine dipstick MODERATE (*)    Protein,  ur 30 (*)    Nitrite POSITIVE (*)    Leukocytes, UA LARGE (*)    All other components within normal limits  POCT PREGNANCY, URINE  CERVICOVAGINAL ANCILLARY ONLY    Imaging Review No results found.   MDM   1. Vaginitis   2. UTI (lower urinary tract infection)        Billy Fischer, MD 09/22/14 2007

## 2014-09-22 NOTE — Discharge Instructions (Signed)
Use medicine as prescribed, we will call if tests show a need for other treatment.  

## 2014-09-23 DIAGNOSIS — Z113 Encounter for screening for infections with a predominantly sexual mode of transmission: Secondary | ICD-10-CM | POA: Diagnosis not present

## 2014-09-23 LAB — CERVICOVAGINAL ANCILLARY ONLY
CHLAMYDIA, DNA PROBE: NEGATIVE
NEISSERIA GONORRHEA: NEGATIVE

## 2014-09-26 LAB — CERVICOVAGINAL ANCILLARY ONLY
WET PREP (BD AFFIRM): NEGATIVE
WET PREP (BD AFFIRM): NEGATIVE
Wet Prep (BD Affirm): POSITIVE — AB

## 2014-09-26 NOTE — ED Notes (Signed)
GC/Chlamydia neg., Affirm: Candida and Trich neg., Gardnerella pos.  Pt. adequately treated with Flagyl. Roselyn Meier 09/26/2014

## 2014-10-28 ENCOUNTER — Ambulatory Visit: Payer: BC Managed Care – PPO | Admitting: Family Medicine

## 2014-12-02 ENCOUNTER — Encounter: Payer: Self-pay | Admitting: Family Medicine

## 2014-12-02 ENCOUNTER — Ambulatory Visit (INDEPENDENT_AMBULATORY_CARE_PROVIDER_SITE_OTHER): Payer: BC Managed Care – PPO | Admitting: Family Medicine

## 2014-12-02 VITALS — BP 109/74 | HR 84 | Temp 98.2°F | Resp 16 | Ht 64.0 in | Wt 134.6 lb

## 2014-12-02 DIAGNOSIS — B373 Candidiasis of vulva and vagina: Secondary | ICD-10-CM

## 2014-12-02 DIAGNOSIS — B3731 Acute candidiasis of vulva and vagina: Secondary | ICD-10-CM

## 2014-12-02 DIAGNOSIS — N760 Acute vaginitis: Secondary | ICD-10-CM | POA: Diagnosis not present

## 2014-12-02 DIAGNOSIS — Z124 Encounter for screening for malignant neoplasm of cervix: Secondary | ICD-10-CM | POA: Diagnosis not present

## 2014-12-02 DIAGNOSIS — A499 Bacterial infection, unspecified: Secondary | ICD-10-CM

## 2014-12-02 DIAGNOSIS — E042 Nontoxic multinodular goiter: Secondary | ICD-10-CM | POA: Diagnosis not present

## 2014-12-02 DIAGNOSIS — N898 Other specified noninflammatory disorders of vagina: Secondary | ICD-10-CM

## 2014-12-02 DIAGNOSIS — Z113 Encounter for screening for infections with a predominantly sexual mode of transmission: Secondary | ICD-10-CM | POA: Diagnosis not present

## 2014-12-02 DIAGNOSIS — B9689 Other specified bacterial agents as the cause of diseases classified elsewhere: Secondary | ICD-10-CM

## 2014-12-02 LAB — POCT URINALYSIS DIPSTICK
Bilirubin, UA: NEGATIVE
Blood, UA: NEGATIVE
GLUCOSE UA: NEGATIVE
Ketones, UA: NEGATIVE
NITRITE UA: NEGATIVE
Protein, UA: NEGATIVE
Spec Grav, UA: 1.02
UROBILINOGEN UA: 0.2
pH, UA: 6.5

## 2014-12-02 LAB — POCT UA - MICROSCOPIC ONLY
CRYSTALS, UR, HPF, POC: NEGATIVE
Casts, Ur, LPF, POC: NEGATIVE
Mucus, UA: NEGATIVE
RBC, URINE, MICROSCOPIC: NEGATIVE
Yeast, UA: NEGATIVE

## 2014-12-02 LAB — POCT WET PREP WITH KOH
KOH Prep POC: NEGATIVE
RBC WET PREP PER HPF POC: NEGATIVE
Trichomonas, UA: NEGATIVE
YEAST WET PREP PER HPF POC: NEGATIVE

## 2014-12-02 LAB — POCT URINE PREGNANCY: PREG TEST UR: NEGATIVE

## 2014-12-02 MED ORDER — METRONIDAZOLE 500 MG PO TABS: 500.0000 mg | ORAL_TABLET | Freq: Two times a day (BID) | ORAL | Status: DC

## 2014-12-02 MED ORDER — FLUCONAZOLE 150 MG PO TABS: 150.0000 mg | ORAL_TABLET | Freq: Once | ORAL | Status: DC

## 2014-12-02 NOTE — Patient Instructions (Signed)
Monilial Vaginitis Vaginitis in a soreness, swelling and redness (inflammation) of the vagina and vulva. Monilial vaginitis is not a sexually transmitted infection. CAUSES  Yeast vaginitis is caused by yeast (candida) that is normally found in your vagina. With a yeast infection, the candida has overgrown in number to a point that upsets the chemical balance. SYMPTOMS   White, thick vaginal discharge.  Swelling, itching, redness and irritation of the vagina and possibly the lips of the vagina (vulva).  Burning or painful urination.  Painful intercourse. DIAGNOSIS  Things that may contribute to monilial vaginitis are:  Postmenopausal and virginal states.  Pregnancy.  Infections.  Being tired, sick or stressed, especially if you had monilial vaginitis in the past.  Diabetes. Good control will help lower the chance.  Birth control pills.  Tight fitting garments.  Using bubble bath, feminine sprays, douches or deodorant tampons.  Taking certain medications that kill germs (antibiotics).  Sporadic recurrence can occur if you become ill. TREATMENT  Your caregiver will give you medication.  There are several kinds of anti monilial vaginal creams and suppositories specific for monilial vaginitis. For recurrent yeast infections, use a suppository or cream in the vagina 2 times a week, or as directed.  Anti-monilial or steroid cream for the itching or irritation of the vulva may also be used. Get your caregiver's permission.  Painting the vagina with methylene blue solution may help if the monilial cream does not work.  Eating yogurt may help prevent monilial vaginitis. HOME CARE INSTRUCTIONS   Finish all medication as prescribed.  Do not have sex until treatment is completed or after your caregiver tells you it is okay.  Take warm sitz baths.  Do not douche.  Do not use tampons, especially scented ones.  Wear cotton underwear.  Avoid tight pants and panty  hose.  Tell your sexual partner that you have a yeast infection. They should go to their caregiver if they have symptoms such as mild rash or itching.  Your sexual partner should be treated as well if your infection is difficult to eliminate.  Practice safer sex. Use condoms.  Some vaginal medications cause latex condoms to fail. Vaginal medications that harm condoms are:  Cleocin cream.  Butoconazole (Femstat).  Terconazole (Terazol) vaginal suppository.  Miconazole (Monistat) (may be purchased over the counter). SEEK MEDICAL CARE IF:   You have a temperature by mouth above 102 F (38.9 C).  The infection is getting worse after 2 days of treatment.  The infection is not getting better after 3 days of treatment.  You develop blisters in or around your vagina.  You develop vaginal bleeding, and it is not your menstrual period.  You have pain when you urinate.  You develop intestinal problems.  You have pain with sexual intercourse. Document Released: 05/08/2005 Document Revised: 10/21/2011 Document Reviewed: 01/20/2009 [facility] Patient Information 2015 [place], [place]. This information is not intended to replace advice given to you by your health care provider. Make sure you discuss any questions you have with your health care provider.  Bacterial Vaginosis Bacterial vaginosis is a vaginal infection that occurs when the normal balance of bacteria in the vagina is disrupted. It results from an overgrowth of certain bacteria. This is the most common vaginal infection in women of childbearing age. Treatment is important to prevent complications, especially in pregnant women, as it can cause a premature delivery. CAUSES  Bacterial vaginosis is caused by an increase in harmful bacteria that are normally present in smaller amounts in  the vagina. Several different kinds of bacteria can cause bacterial vaginosis. However, the reason that the condition develops is not fully  understood. RISK FACTORS Certain activities or behaviors can put you at an increased risk of developing bacterial vaginosis, including:  Having a new sex partner or multiple sex partners.  Douching.  Using an intrauterine device (IUD) for contraception. Women do not get bacterial vaginosis from toilet seats, bedding, swimming pools, or contact with objects around them. SIGNS AND SYMPTOMS  Some women with bacterial vaginosis have no signs or symptoms. Common symptoms include:  Grey vaginal discharge.  A fishlike odor with discharge, especially after sexual intercourse.  Itching or burning of the vagina and vulva.  Burning or pain with urination. DIAGNOSIS  Your health care provider will take a medical history and examine the vagina for signs of bacterial vaginosis. A sample of vaginal fluid may be taken. Your health care provider will look at this sample under a microscope to check for bacteria and abnormal cells. A vaginal pH test may also be done.  TREATMENT  Bacterial vaginosis may be treated with antibiotic medicines. These may be given in the form of a pill or a vaginal cream. A second round of antibiotics may be prescribed if the condition comes back after treatment.  HOME CARE INSTRUCTIONS   Only take over-the-counter or prescription medicines as directed by your health care provider.  If antibiotic medicine was prescribed, take it as directed. Make sure you finish it even if you start to feel better.  Do not have sex until treatment is completed.  Tell all sexual partners that you have a vaginal infection. They should see their health care provider and be treated if they have problems, such as a mild rash or itching.  Practice safe sex by using condoms and only having one sex partner. SEEK MEDICAL CARE IF:   Your symptoms are not improving after 3 days of treatment.  You have increased discharge or pain.  You have a fever. MAKE SURE YOU:   Understand these  instructions.  Will watch your condition.  Will get help right away if you are not doing well or get worse. FOR MORE INFORMATION  Centers for Disease Control and Prevention, Division of STD Prevention: AppraiserFraud.fi American Sexual Health Association (ASHA): www.ashastd.org  Document Released: 07/29/2005 Document Revised: 05/19/2013 Document Reviewed: 03/10/2013 [facility] Patient Information 2015 [place], [place]. This information is not intended to replace advice given to you by your health care provider. Make sure you discuss any questions you have with your health care provider.

## 2014-12-02 NOTE — Progress Notes (Signed)
Subjective:    Patient ID: Kim Alvarez, female    DOB: 03/27/1986, 29 y.o.   MRN: [id number] This chart was scribed for Delman Cheadle, MD by Zola Button, Medical Scribe. This patient was seen in Room 27 and the patient's care was started at 10:53 AM.   Chief Complaint  Patient presents with  . Establish Care  . Vaginal Itching  . Vaginal Discharge    HPI HPI Comments: Kim Alvarez is a 29 y.o. female who presents to the [facility] complaining of vaginal irritation and itching for the past 2 days.  Has noticed a small amount of thick white clumpy discharge in her vagina but not a lot is cmoing out.   Normal menses, no problems, IUD was placed Jan 2012.  Was placed in [place], [place].  Moved to [place].  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   Is sexually active and dating but feels like she might develop a relationship again.  Past Medical History  Diagnosis Date  . Abnormal Pap smear 2007  . Yeast infection   . Chlamydia 2013    treated  . H/O varicella   . FHx: cancer   . IUD (intrauterine device) in place 08/2010  . Fibroids 2011     Fibroids during pregnancy  . Sickle cell trait   . Family history of sickle cell anemia (Hgb SS) in mother   . Goiter, nontoxic, multinodular 2013  . Allergy    Current Outpatient Prescriptions on File Prior to Visit  Medication Sig Dispense Refill  . levonorgestrel (MIRENA) 20 MCG/24HR IUD 1 each by Intrauterine route once.    . tinidazole (TINDAMAX) 500 MG tablet Take 4 tablets (2,000 mg total) by mouth once. REPEAT 1D (Patient not taking: Reported on 12/02/2014) 8 tablet 0   No current facility-administered medications on file prior to visit.   No Known Allergies  Review of Systems  Constitutional: Negative for fever, chills, diaphoresis, activity change, appetite change, fatigue and unexpected weight  change.  HENT: Negative for sore throat, trouble swallowing and voice change.   Genitourinary: Positive for flank pain and vaginal discharge. Negative for dysuria, frequency, hematuria, decreased urine volume, difficulty urinating, vaginal pain and menstrual problem.  Skin: Negative for rash.       Objective:  BP 109/74 mmHg  Pulse 84  Temp(Src) 98.2 F (36.8 C) (Oral)  Resp 16  Ht 5\' 4"  (1.626 m)  Wt 134 lb 9.6 oz (61.054 kg)  BMI 23.09 kg/m2  SpO2 100%  LMP 11/14/2014  Physical Exam  Constitutional: She is oriented to person, place, and time. She appears well-developed and well-nourished. No distress.  HENT:  Head: Normocephalic and atraumatic.  Mouth/Throat: Oropharynx is clear and moist. No oropharyngeal exudate.  Eyes: Pupils are equal, round, and reactive to light.  Neck: Neck supple.  Cardiovascular: Normal rate.   Pulmonary/Chest: Effort normal.  Musculoskeletal: She exhibits no edema.  Neurological: She is alert and oriented to person, place, and time. No cranial nerve deficit.  Skin: Skin is warm and dry. No rash noted.  Psychiatric: She has a normal mood and affect. Her behavior is normal.  Vitals reviewed. GU: Nml labia majora and minora, copius amount of thick white clumpy vaginal discharge, cervix appears otherwise nml, no erythema or friability.  Results for orders placed or performed in visit on 12/02/14  GC/Chlamydia Probe  Amp  Result Value Ref Range   CT Probe RNA NEGATIVE    GC Probe RNA NEGATIVE   Urine culture  Result Value Ref Range   Colony Count NO GROWTH    Organism ID, Bacteria NO GROWTH   Thyroid Panel With TSH  Result Value Ref Range   T4, Total 6.9 4.5 - 12.0 ug/dL   T3 Uptake 27 22 - 35 %   Free Thyroxine Index 1.9 1.4 - 3.8   TSH 0.310 (L) 0.350 - 4.500 uIU/mL  HIV antibody (with reflex)  Result Value Ref Range   HIV 1&2 Ab, 4th Generation NONREACTIVE NONREACTIVE  POCT Wet Prep with KOH  Result Value Ref Range   Trichomonas, UA  Negative    Clue Cells Wet Prep HPF POC 6-10    Epithelial Wet Prep HPF POC 6-10    Yeast Wet Prep HPF POC neg    Bacteria Wet Prep HPF POC 1+    RBC Wet Prep HPF POC neg    WBC Wet Prep HPF POC 6-20    KOH Prep POC Negative   POCT UA - Microscopic Only  Result Value Ref Range   WBC, Ur, HPF, POC 0-1    RBC, urine, microscopic neg    Bacteria, U Microscopic trace    Mucus, UA neg    Epithelial cells, urine per micros 0-1    Crystals, Ur, HPF, POC neg    Casts, Ur, LPF, POC neg    Yeast, UA neg   POCT urinalysis dipstick  Result Value Ref Range   Color, UA yellow    Clarity, UA clear    Glucose, UA neg    Bilirubin, UA neg    Ketones, UA neg    Spec Grav, UA 1.020    Blood, UA neg    pH, UA 6.5    Protein, UA neg    Urobilinogen, UA 0.2    Nitrite, UA neg    Leukocytes, UA Trace   POCT urine pregnancy  Result Value Ref Range   Preg Test, Ur Negative          Assessment & Plan:   1. Vaginal discharge   2. Multinodular goiter   3. Screen for STD (sexually transmitted disease)   4. Screening for cervical cancer   5.  BV  Orders Placed This Encounter  Procedures  . GC/Chlamydia Probe Amp  . Urine culture  . Thyroid Panel With TSH  . HIV antibody (with reflex)  . POCT Wet Prep with KOH  . POCT UA - Microscopic Only  . POCT urinalysis dipstick  . POCT urine pregnancy    Meds ordered this encounter  Medications  . fluconazole (DIFLUCAN) 150 MG tablet    Sig: Take 1 tablet (150 mg total) by mouth once. Repeat after 3 days if needed    Dispense:  2 tablet    Refill:  0  . metroNIDAZOLE (FLAGYL) 500 MG tablet    Sig: Take 1 tablet (500 mg total) by mouth 2 (two) times daily.    Dispense:  14 tablet    Refill:  0     Delman Cheadle, MD MPH

## 2014-12-03 LAB — THYROID PANEL WITH TSH
Free Thyroxine Index: 1.9 (ref 1.4–3.8)
T3 UPTAKE: 27 % (ref 22–35)
T4, Total: 6.9 ug/dL (ref 4.5–12.0)
TSH: 0.31 u[IU]/mL — ABNORMAL LOW (ref 0.350–4.500)

## 2014-12-03 LAB — GC/CHLAMYDIA PROBE AMP
CT Probe RNA: NEGATIVE
GC Probe RNA: NEGATIVE

## 2014-12-03 LAB — HIV ANTIBODY (ROUTINE TESTING W REFLEX): HIV 1&2 Ab, 4th Generation: NONREACTIVE

## 2014-12-04 LAB — URINE CULTURE
COLONY COUNT: NO GROWTH
Organism ID, Bacteria: NO GROWTH

## 2015-01-26 ENCOUNTER — Ambulatory Visit: Payer: BC Managed Care – PPO | Admitting: Women's Health

## 2015-01-31 ENCOUNTER — Ambulatory Visit: Payer: BC Managed Care – PPO | Admitting: Gynecology

## 2015-02-17 ENCOUNTER — Ambulatory Visit (INDEPENDENT_AMBULATORY_CARE_PROVIDER_SITE_OTHER): Payer: BC Managed Care – PPO | Admitting: Women's Health

## 2015-02-17 ENCOUNTER — Other Ambulatory Visit: Payer: Self-pay | Admitting: Women's Health

## 2015-02-17 ENCOUNTER — Encounter: Payer: Self-pay | Admitting: Women's Health

## 2015-02-17 ENCOUNTER — Telehealth: Payer: Self-pay | Admitting: *Deleted

## 2015-02-17 VITALS — BP 110/68 | Ht 64.0 in | Wt 141.0 lb

## 2015-02-17 DIAGNOSIS — Z01419 Encounter for gynecological examination (general) (routine) without abnormal findings: Secondary | ICD-10-CM | POA: Diagnosis not present

## 2015-02-17 DIAGNOSIS — B373 Candidiasis of vulva and vagina: Secondary | ICD-10-CM

## 2015-02-17 DIAGNOSIS — B3731 Acute candidiasis of vulva and vagina: Secondary | ICD-10-CM

## 2015-02-17 DIAGNOSIS — Z975 Presence of (intrauterine) contraceptive device: Secondary | ICD-10-CM

## 2015-02-17 LAB — WET PREP FOR TRICH, YEAST, CLUE
CLUE CELLS WET PREP: NONE SEEN
TRICH WET PREP: NONE SEEN
Yeast Wet Prep HPF POC: NONE SEEN

## 2015-02-17 MED ORDER — FLUCONAZOLE 150 MG PO TABS: 150.0000 mg | ORAL_TABLET | Freq: Once | ORAL | Status: DC

## 2015-02-17 NOTE — Progress Notes (Signed)
Kim Alvarez February 21, 1986 [id number]    History:    Presents for annual exam.  08/2010 Mirena IUD. History of a goiter, 11/2014 TSH 0.31 with normal T3, T4 at primary care. Has had a problem with recurrent yeast. Negative STD screen. Normal Pap 01/2014. History of an abnormal Pap about 10 years ago with no treatment needed normal Paps after.  Past medical history, past surgical history, family history and social history were all reviewed and documented in the EPIC chart. 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 Has a 31-year-old son Kim Alvarez doing well. Mother sickle cell anemia.  ROS:  A ROS was performed and pertinent positives and negatives are included.  Exam:  Filed Vitals:   02/17/15 1402  BP: 110/68    General appearance:  Normal Thyroid:  Symmetrical, normal in size, without palpable masses or nodularity. Respiratory  Auscultation:  Clear without wheezing or rhonchi Cardiovascular  Auscultation:  Regular rate, without rubs, murmurs or gallops  Edema/varicosities:  Not grossly evident Abdominal  Soft,nontender, without masses, guarding or rebound.  Liver/spleen:  No organomegaly noted  Hernia:  None appreciated  Skin  Inspection:  Grossly normal   Breasts: Examined lying and sitting.     Right: Without masses, retractions, discharge or 1 cm mobile nontender axillary lymph node      Left: Without masses, retractions, discharge or 1 cm mobile nontender axillary lymph node  Gentitourinary   Inguinal/mons:  Normal without inguinal adenopathy  External genitalia:  Normal  BUS/Urethra/Skene's glands:  Normal  Vagina:  Normal moderate amount of cream/Monistat wet prep negative  Cervix:  Normal IUD strings visible  Uterus:   normal in size, shape and contour.  Midline and mobile  Adnexa/parametria:     Rt: Without masses or tenderness.   Lt: Without masses or tenderness.  Anus and perineum: Normal  Digital rectal exam: Normal sphincter tone without palpated masses or  tenderness  Assessment/Plan:  29 y.o. SBF G1 P1 for annual exam.     08/2010 Mirena IUD amenorrheic Recurrent yeast Goiter with borderline TSH-primary care managing  Plan: Yeast prevention discussed, options reviewed will try boric acid gelcaps twice weekly, prescription, proper use given and reviewed. Will call if continued problems. SBE's, regular exercise, calcium rich diet, MVI daily encouraged. Has a 1 cm mobile lymph node in both right and left axilla area will watch if continues to persist instructed to call back. CBC, glucose, UA, Pap, 2015, new screening guidelines reviewed. Contraception options reviewed if would like to continue with Mirena IUD return to the office January 2017, Dr. Toney Rakes to remove and replace.   [place], 3:01 PM 02/17/2015

## 2015-02-17 NOTE — Patient Instructions (Addendum)
Health Maintenance Adopting a healthy lifestyle and getting preventive care can go a long way to promote health and wellness. Talk with your health care provider about what schedule of regular examinations is right for you. This is a good chance for you to check in with your provider about disease prevention and staying healthy. In between checkups, there are plenty of things you can do on your own. Experts have done a lot of research about which lifestyle changes and preventive measures are most likely to keep you healthy. Ask your health care provider for more information. WEIGHT AND DIET  Eat a healthy diet  Be sure to include plenty of vegetables, fruits, low-fat dairy products, and lean protein.  Do not eat a lot of foods high in solid fats, added sugars, or salt.  Get regular exercise. This is one of the most important things you can do for your health.  Most adults should exercise for at least 150 minutes each week. The exercise should increase your heart rate and make you sweat (moderate-intensity exercise).  Most adults should also do strengthening exercises at least twice a week. This is in addition to the moderate-intensity exercise.  Maintain a healthy weight  Body mass index (BMI) is a measurement that can be used to identify possible weight problems. It estimates body fat based on height and weight. Your health care provider can help determine your BMI and help you achieve or maintain a healthy weight.  For females 25 years of age and older:   A BMI below 18.5 is considered underweight.  A BMI of 18.5 to 24.9 is normal.  A BMI of 25 to 29.9 is considered overweight.  A BMI of 30 and above is considered obese.  Watch levels of cholesterol and blood lipids  You should start having your blood tested for lipids and cholesterol at 28 years of age, then have this test every 5 years.  You may need to have your cholesterol levels checked more often if:  Your lipid or  cholesterol levels are high.  You are older than 28 years of age.  You are at high risk for heart disease.  CANCER SCREENING   Lung Cancer  Lung cancer screening is recommended for adults 97-92 years old who are at high risk for lung cancer because of a history of smoking.  A yearly low-dose CT scan of the lungs is recommended for people who:  Currently smoke.  Have quit within the past 15 years.  Have at least a 30-pack-year history of smoking. A pack year is smoking an average of one pack of cigarettes a day for 1 year.  Yearly screening should continue until it has been 15 years since you quit.  Yearly screening should stop if you develop a health problem that would prevent you from having lung cancer treatment.  Breast Cancer  Practice breast self-awareness. This means understanding how your breasts normally appear and feel.  It also means doing regular breast self-exams. Let your health care provider know about any changes, no matter how small.  If you are in your 20s or 30s, you should have a clinical breast exam (CBE) by a health care provider every 1-3 years as part of a regular health exam.  If you are 76 or older, have a CBE every year. Also consider having a breast X-ray (mammogram) every year.  If you have a family history of breast cancer, talk to your health care provider about genetic screening.  If you are  at high risk for breast cancer, talk to your health care provider about having an MRI and a mammogram every year.  Breast cancer gene (BRCA) assessment is recommended for women who have family members with BRCA-related cancers. BRCA-related cancers include:  Breast.  Ovarian.  Tubal.  Peritoneal cancers.  Results of the assessment will determine the need for genetic counseling and BRCA1 and BRCA2 testing. Cervical Cancer Routine pelvic examinations to screen for cervical cancer are no longer recommended for nonpregnant women who are considered low  risk for cancer of the pelvic organs (ovaries, uterus, and vagina) and who do not have symptoms. A pelvic examination may be necessary if you have symptoms including those associated with pelvic infections. Ask your health care provider if a screening pelvic exam is right for you.   The Pap test is the screening test for cervical cancer for women who are considered at risk.  If you had a hysterectomy for a problem that was not cancer or a condition that could lead to cancer, then you no longer need Pap tests.  If you are older than 65 years, and you have had normal Pap tests for the past 10 years, you no longer need to have Pap tests.  If you have had past treatment for cervical cancer or a condition that could lead to cancer, you need Pap tests and screening for cancer for at least 20 years after your treatment.  If you no longer get a Pap test, assess your risk factors if they change (such as having a new sexual partner). This can affect whether you should start being screened again.  Some women have medical problems that increase their chance of getting cervical cancer. If this is the case for you, your health care provider may recommend more frequent screening and Pap tests.  The human papillomavirus (HPV) test is another test that may be used for cervical cancer screening. The HPV test looks for the virus that can cause cell changes in the cervix. The cells collected during the Pap test can be tested for HPV.  The HPV test can be used to screen women 30 years of age and older. Getting tested for HPV can extend the interval between normal Pap tests from three to five years.  An HPV test also should be used to screen women of any age who have unclear Pap test results.  After 28 years of age, women should have HPV testing as often as Pap tests.  Colorectal Cancer  This type of cancer can be detected and often prevented.  Routine colorectal cancer screening usually begins at 28 years of  age and continues through 28 years of age.  Your health care provider may recommend screening at an earlier age if you have risk factors for colon cancer.  Your health care provider may also recommend using home test kits to check for hidden blood in the stool.  A small camera at the end of a tube can be used to examine your colon directly (sigmoidoscopy or colonoscopy). This is done to check for the earliest forms of colorectal cancer.  Routine screening usually begins at age 50.  Direct examination of the colon should be repeated every 5-10 years through 28 years of age. However, you may need to be screened more often if early forms of precancerous polyps or small growths are found. Skin Cancer  Check your skin from head to toe regularly.  Tell your health care provider about any new moles or changes in   moles, especially if there is a change in a mole's shape or color.  Also tell your health care provider if you have a mole that is larger than the size of a pencil eraser.  Always use sunscreen. Apply sunscreen liberally and repeatedly throughout the day.  Protect yourself by wearing long sleeves, pants, a wide-brimmed hat, and sunglasses whenever you are outside. HEART DISEASE, DIABETES, AND HIGH BLOOD PRESSURE   Have your blood pressure checked at least every 1-2 years. High blood pressure causes heart disease and increases the risk of stroke.  If you are between 75 years and 42 years old, ask your health care provider if you should take aspirin to prevent strokes.  Have regular diabetes screenings. This involves taking a blood sample to check your fasting blood sugar level.  If you are at a normal weight and have a low risk for diabetes, have this test once every three years after 28 years of age.  If you are overweight and have a high risk for diabetes, consider being tested at a younger age or more often. PREVENTING INFECTION  Hepatitis B  If you have a higher risk for  hepatitis B, you should be screened for this virus. You are considered at high risk for hepatitis B if:  You were born in a country where hepatitis B is common. Ask your health care provider which countries are considered high risk.  Your parents were born in a high-risk country, and you have not been immunized against hepatitis B (hepatitis B vaccine).  You have HIV or AIDS.  You use needles to inject street drugs.  You live with someone who has hepatitis B.  You have had sex with someone who has hepatitis B.  You get hemodialysis treatment.  You take certain medicines for conditions, including cancer, organ transplantation, and autoimmune conditions. Hepatitis C  Blood testing is recommended for:  Everyone born from [birth year] through 1964.  Anyone with known risk factors for hepatitis C. Sexually transmitted infections (STIs)  You should be screened for sexually transmitted infections (STIs) including gonorrhea and chlamydia if:  You are sexually active and are younger than 28 years of age.  You are older than 28 years of age and your health care provider tells you that you are at risk for this type of infection.  Your sexual activity has changed since you were last screened and you are at an increased risk for chlamydia or gonorrhea. Ask your health care provider if you are at risk.  If you do not have HIV, but are at risk, it may be recommended that you take a prescription medicine daily to prevent HIV infection. This is called pre-exposure prophylaxis (PrEP). You are considered at risk if:  You are sexually active and do not regularly use condoms or know the HIV status of your partner(s).  You take drugs by injection.  You are sexually active with a partner who has HIV. Talk with your health care provider about whether you are at high risk of being infected with HIV. If you choose to begin PrEP, you should first be tested for HIV. You should then be tested every 3 months for  as long as you are taking PrEP.  PREGNANCY   If you are premenopausal and you may become pregnant, ask your health care provider about preconception counseling.  If you may become pregnant, take 400 to 800 micrograms (mcg) of folic acid every day.  If you want to prevent pregnancy, talk to your  health care provider about birth control (contraception). OSTEOPOROSIS AND MENOPAUSE   Osteoporosis is a disease in which the bones lose minerals and strength with aging. This can result in serious bone fractures. Your risk for osteoporosis can be identified using a bone density scan.  If you are 65 years of age or older, or if you are at risk for osteoporosis and fractures, ask your health care provider if you should be screened.  Ask your health care provider whether you should take a calcium or vitamin D supplement to lower your risk for osteoporosis.  Menopause may have certain physical symptoms and risks.  Hormone replacement therapy may reduce some of these symptoms and risks. Talk to your health care provider about whether hormone replacement therapy is right for you.  HOME CARE INSTRUCTIONS   Schedule regular health, dental, and eye exams.  Stay current with your immunizations.   Do not use any tobacco products including cigarettes, chewing tobacco, or electronic cigarettes.  If you are pregnant, do not drink alcohol.  If you are breastfeeding, limit how much and how often you drink alcohol.  Limit alcohol intake to no more than 1 drink per day for nonpregnant women. One drink equals 12 ounces of beer, 5 ounces of wine, or 1 ounces of hard liquor.  Do not use street drugs.  Do not share needles.  Ask your health care provider for help if you need support or information about quitting drugs.  Tell your health care provider if you often feel depressed.  Tell your health care provider if you have ever been abused or do not feel safe at home. Document Released: 02/11/2011  Document Revised: 12/13/2013 Document Reviewed: 06/30/2013 ExitCare Patient Information 2015 [employer]. This information is not intended to replace advice given to you by your health care provider. Make sure you discuss any questions you have with your health care provider. Monilial Vaginitis Vaginitis in a soreness, swelling and redness (inflammation) of the vagina and vulva. Monilial vaginitis is not a sexually transmitted infection. CAUSES  Yeast vaginitis is caused by yeast (candida) that is normally found in your vagina. With a yeast infection, the candida has overgrown in number to a point that upsets the chemical balance. SYMPTOMS   White, thick vaginal discharge.  Swelling, itching, redness and irritation of the vagina and possibly the lips of the vagina (vulva).  Burning or painful urination.  Painful intercourse. DIAGNOSIS  Things that may contribute to monilial vaginitis are:  Postmenopausal and virginal states.  Pregnancy.  Infections.  Being tired, sick or stressed, especially if you had monilial vaginitis in the past.  Diabetes. Good control will help lower the chance.  Birth control pills.  Tight fitting garments.  Using bubble bath, feminine sprays, douches or deodorant tampons.  Taking certain medications that kill germs (antibiotics).  Sporadic recurrence can occur if you become ill. TREATMENT  Your caregiver will give you medication.  There are several kinds of anti monilial vaginal creams and suppositories specific for monilial vaginitis. For recurrent yeast infections, use a suppository or cream in the vagina 2 times a week, or as directed.  Anti-monilial or steroid cream for the itching or irritation of the vulva may also be used. Get your caregiver's permission.  Painting the vagina with methylene blue solution may help if the monilial cream does not work.  Eating yogurt may help prevent monilial vaginitis. HOME CARE INSTRUCTIONS   Finish  all medication as prescribed.  Do not have sex until treatment is   completed or after your caregiver tells you it is okay.  Take warm sitz baths.  Do not douche.  Do not use tampons, especially scented ones.  Wear cotton underwear.  Avoid tight pants and panty hose.  Tell your sexual partner that you have a yeast infection. They should go to their caregiver if they have symptoms such as mild rash or itching.  Your sexual partner should be treated as well if your infection is difficult to eliminate.  Practice safer sex. Use condoms.  Some vaginal medications cause latex condoms to fail. Vaginal medications that harm condoms are:  Cleocin cream.  Butoconazole (Femstat).  Terconazole (Terazol) vaginal suppository.  Miconazole (Monistat) (may be purchased over the counter). SEEK MEDICAL CARE IF:   You have a temperature by mouth above 102 F (38.9 C).  The infection is getting worse after 2 days of treatment.  The infection is not getting better after 3 days of treatment.  You develop blisters in or around your vagina.  You develop vaginal bleeding, and it is not your menstrual period.  You have pain when you urinate.  You develop intestinal problems.  You have pain with sexual intercourse. Document Released: 05/08/2005 Document Revised: 10/21/2011 Document Reviewed: 01/20/2009 ExitCare Patient Information 2015 [employer]. This information is not intended to replace advice given to you by your health care provider. Make sure you discuss any questions you have with your health care provider.  

## 2015-02-17 NOTE — Telephone Encounter (Signed)
We found a prescription, Earnest Bailey has called it in, thank you

## 2015-02-17 NOTE — Telephone Encounter (Signed)
Kim Alvarez pt lost paper Rx for boric acid, do you want #30 with refills called in?

## 2015-02-24 ENCOUNTER — Ambulatory Visit: Payer: BC Managed Care – PPO | Admitting: Obstetrics and Gynecology

## 2015-03-08 ENCOUNTER — Ambulatory Visit: Payer: BC Managed Care – PPO | Admitting: Obstetrics and Gynecology

## 2015-06-14 ENCOUNTER — Ambulatory Visit (INDEPENDENT_AMBULATORY_CARE_PROVIDER_SITE_OTHER): Payer: BC Managed Care – PPO | Admitting: Women's Health

## 2015-06-14 ENCOUNTER — Encounter: Payer: Self-pay | Admitting: Women's Health

## 2015-06-14 VITALS — BP 124/80 | Ht 64.0 in | Wt 141.0 lb

## 2015-06-14 DIAGNOSIS — N898 Other specified noninflammatory disorders of vagina: Secondary | ICD-10-CM | POA: Diagnosis not present

## 2015-06-14 DIAGNOSIS — R1032 Left lower quadrant pain: Secondary | ICD-10-CM | POA: Diagnosis not present

## 2015-06-14 LAB — WET PREP FOR TRICH, YEAST, CLUE
Clue Cells Wet Prep HPF POC: NONE SEEN
Trich, Wet Prep: NONE SEEN
YEAST WET PREP: NONE SEEN

## 2015-06-14 LAB — PREGNANCY, URINE: Preg Test, Ur: NEGATIVE

## 2015-06-14 NOTE — Addendum Note (Signed)
Addended by: Thamas Jaegers on: 06/14/2015 03:50 PM   Modules accepted: Orders

## 2015-06-14 NOTE — Progress Notes (Signed)
Presents today for left lower abdominal discomfort x 3 days. Noticed pain when son was sitting on her abdomen. Had one episode of severe cramping yesterday. Attended two social events this past weekend and consumed more alcohol than normal. IUD placed 08/2014/regular monthly cycles until September. LMP was 05/12/2015. Tested positive for Chlamydia in 01/2014, negative STD screen in 11/2014, one new partner since, used condom, no sexual activity since August. Denies nausea, vomiting, constipation, urinary symptoms, vaginal discharge or odor. Is interested in becoming pregnant again in near future.  Exam: Appears well. Mild tenderness on deep palpation of LLQ. External genitalia appear within normal limits. Speculum exam revealed no erythema, odor, or lesions. White discharge noted. IUD strings visualized. No uterine or adnexal tenderness on bimanual exam. UPT negative. Wet prep showed many WBCs and few bacteria.   Lower abdominal pain  STD screen  Plan: Encouraged bland diet, no alcohol few days, consume BRAT diet. Cal if sx do not improve over the next few days or worsen. Ultrasound discussed if symptoms continue. Follow up in January for removal of IUD and further family planning. GC/Chlamydia pending.

## 2015-06-14 NOTE — Patient Instructions (Signed)

## 2015-06-15 LAB — GC/CHLAMYDIA PROBE AMP
CT PROBE, AMP APTIMA: NEGATIVE
GC PROBE AMP APTIMA: NEGATIVE

## 2015-06-20 ENCOUNTER — Telehealth: Payer: Self-pay | Admitting: *Deleted

## 2015-06-20 NOTE — Telephone Encounter (Signed)
Pt informed with negative results on OV 06/14/15

## 2015-08-22 ENCOUNTER — Ambulatory Visit (INDEPENDENT_AMBULATORY_CARE_PROVIDER_SITE_OTHER): Payer: BC Managed Care – PPO | Admitting: Women's Health

## 2015-08-22 ENCOUNTER — Encounter: Payer: Self-pay | Admitting: Women's Health

## 2015-08-22 VITALS — BP 118/80 | Ht 64.0 in | Wt 141.0 lb

## 2015-08-22 DIAGNOSIS — Z975 Presence of (intrauterine) contraceptive device: Secondary | ICD-10-CM

## 2015-08-22 DIAGNOSIS — Z30432 Encounter for removal of intrauterine contraceptive device: Secondary | ICD-10-CM | POA: Diagnosis not present

## 2015-08-22 NOTE — Patient Instructions (Signed)
Contraception Choices Contraception (birth control) is the use of any methods or devices to prevent pregnancy. Below are some methods to help avoid pregnancy. HORMONAL METHODS   Contraceptive implant. This is a thin, plastic tube containing progesterone hormone. It does not contain estrogen hormone. Your health care provider inserts the tube in the inner part of the upper arm. The tube can remain in place for up to 3 years. After 3 years, the implant must be removed. The implant prevents the ovaries from releasing an egg (ovulation), thickens the cervical mucus to prevent sperm from entering the uterus, and thins the lining of the inside of the uterus.  Progesterone-only injections. These injections are given every 3 months by your health care provider to prevent pregnancy. This synthetic progesterone hormone stops the ovaries from releasing eggs. It also thickens cervical mucus and changes the uterine lining. This makes it harder for sperm to survive in the uterus.  Birth control pills. These pills contain estrogen and progesterone hormone. They work by preventing the ovaries from releasing eggs (ovulation). They also cause the cervical mucus to thicken, preventing the sperm from entering the uterus. Birth control pills are prescribed by a health care provider.Birth control pills can also be used to treat heavy periods.  Minipill. This type of birth control pill contains only the progesterone hormone. They are taken every day of each month and must be prescribed by your health care provider.  Birth control patch. The patch contains hormones similar to those in birth control pills. It must be changed once a week and is prescribed by a health care provider.  Vaginal ring. The ring contains hormones similar to those in birth control pills. It is left in the vagina for 3 weeks, removed for 1 week, and then a new one is put back in place. The patient must be comfortable inserting and removing the ring  from the vagina.A health care provider's prescription is necessary.  Emergency contraception. Emergency contraceptives prevent pregnancy after unprotected sexual intercourse. This pill can be taken right after sex or up to 5 days after unprotected sex. It is most effective the sooner you take the pills after having sexual intercourse. Most emergency contraceptive pills are available without a prescription. Check with your pharmacist. Do not use emergency contraception as your only form of birth control. BARRIER METHODS   Female condom. This is a thin sheath (latex or rubber) that is worn over the penis during sexual intercourse. It can be used with spermicide to increase effectiveness.  Female condom. This is a soft, loose-fitting sheath that is put into the vagina before sexual intercourse.  Diaphragm. This is a soft, latex, dome-shaped barrier that must be fitted by a health care provider. It is inserted into the vagina, along with a spermicidal jelly. It is inserted before intercourse. The diaphragm should be left in the vagina for 6 to 8 hours after intercourse.  Cervical cap. This is a round, soft, latex or plastic cup that fits over the cervix and must be fitted by a health care provider. The cap can be left in place for up to 48 hours after intercourse.  Sponge. This is a soft, circular piece of polyurethane foam. The sponge has spermicide in it. It is inserted into the vagina after wetting it and before sexual intercourse.  Spermicides. These are chemicals that kill or block sperm from entering the cervix and uterus. They come in the form of creams, jellies, suppositories, foam, or tablets. They do not require a   prescription. They are inserted into the vagina with an applicator before having sexual intercourse. The process must be repeated every time you have sexual intercourse. INTRAUTERINE CONTRACEPTION  Intrauterine device (IUD). This is a T-shaped device that is put in a woman's uterus  during a menstrual period to prevent pregnancy. There are 2 types:  Copper IUD. This type of IUD is wrapped in copper wire and is placed inside the uterus. Copper makes the uterus and fallopian tubes produce a fluid that kills sperm. It can stay in place for 10 years.  Hormone IUD. This type of IUD contains the hormone progestin (synthetic progesterone). The hormone thickens the cervical mucus and prevents sperm from entering the uterus, and it also thins the uterine lining to prevent implantation of a fertilized egg. The hormone can weaken or kill the sperm that get into the uterus. It can stay in place for 3-5 years, depending on which type of IUD is used. PERMANENT METHODS OF CONTRACEPTION  Female tubal ligation. This is when the woman's fallopian tubes are surgically sealed, tied, or blocked to prevent the egg from traveling to the uterus.  Hysteroscopic sterilization. This involves placing a small coil or insert into each fallopian tube. Your doctor uses a technique called hysteroscopy to do the procedure. The device causes scar tissue to form. This results in permanent blockage of the fallopian tubes, so the sperm cannot fertilize the egg. It takes about 3 months after the procedure for the tubes to become blocked. You must use another form of birth control for these 3 months.  Female sterilization. This is when the female has the tubes that carry sperm tied off (vasectomy).This blocks sperm from entering the vagina during sexual intercourse. After the procedure, the man can still ejaculate fluid (semen). NATURAL PLANNING METHODS  Natural family planning. This is not having sexual intercourse or using a barrier method (condom, diaphragm, cervical cap) on days the woman could become pregnant.  Calendar method. This is keeping track of the length of each menstrual cycle and identifying when you are fertile.  Ovulation method. This is avoiding sexual intercourse during ovulation.  Symptothermal  method. This is avoiding sexual intercourse during ovulation, using a thermometer and ovulation symptoms.  Post-ovulation method. This is timing sexual intercourse after you have ovulated. Regardless of which type or method of contraception you choose, it is important that you use condoms to protect against the transmission of sexually transmitted infections (STIs). Talk with your health care provider about which form of contraception is most appropriate for you.   This information is not intended to replace advice given to you by your health care provider. Make sure you discuss any questions you have with your health care provider.   Document Released: 07/29/2005 Document Revised: 08/03/2013 Document Reviewed: 01/21/2013 [employer] Interactive Patient Education 2016 [employer]  

## 2015-08-22 NOTE — Progress Notes (Signed)
Patient ID: Kim Alvarez, female   DOB: 1986-01-04, 30 y.o.   MRN: [id number] Presents for IUD removal. Mirena IUD placed 08/2010. Same partner.  Denies any discharge, urinary symptoms or problems. Has light cycle most months.  Exam: Appears well. External genitalia within normal limits, speculum exam IUD strings visible grasped with ring forcep removed intact shown to patient and discarded.  Contraception management/removal of Mirena IUD  Plan: Contraception options reviewed and declined will use condoms. Plan B emergency contraception reviewed. Instructed to keep menstrual calendar and if cycles do not regulate instructed to call.

## 2015-08-24 ENCOUNTER — Ambulatory Visit: Payer: BC Managed Care – PPO | Admitting: Women's Health

## 2016-07-12 ENCOUNTER — Encounter: Payer: Self-pay | Admitting: Women's Health

## 2016-07-12 ENCOUNTER — Ambulatory Visit (INDEPENDENT_AMBULATORY_CARE_PROVIDER_SITE_OTHER): Payer: BC Managed Care – PPO | Admitting: Women's Health

## 2016-07-12 VITALS — BP 120/78 | Ht 64.0 in | Wt 133.0 lb

## 2016-07-12 DIAGNOSIS — Z3201 Encounter for pregnancy test, result positive: Secondary | ICD-10-CM | POA: Diagnosis not present

## 2016-07-12 DIAGNOSIS — O3680X0 Pregnancy with inconclusive fetal viability, not applicable or unspecified: Secondary | ICD-10-CM

## 2016-07-12 DIAGNOSIS — N912 Amenorrhea, unspecified: Secondary | ICD-10-CM | POA: Diagnosis not present

## 2016-07-12 LAB — PREGNANCY, URINE: Preg Test, Ur: POSITIVE — AB

## 2016-07-12 NOTE — Patient Instructions (Signed)
First Trimester of Pregnancy The first trimester of pregnancy is from week 1 until the end of week 12 (months 1 through 3). A week after a sperm fertilizes an egg, the egg will implant on the wall of the uterus. This embryo will begin to develop into a baby. Genes from you and your partner are forming the baby. The female genes determine whether the baby is a boy or a girl. At 6-8 weeks, the eyes and face are formed, and the heartbeat can be seen on ultrasound. At the end of 12 weeks, all the baby's organs are formed.  Now that you are pregnant, you will want to do everything you can to have a healthy baby. Two of the most important things are to get good prenatal care and to follow your health care provider's instructions. Prenatal care is all the medical care you receive before the baby's birth. This care will help prevent, find, and treat any problems during the pregnancy and childbirth. BODY CHANGES Your body goes through many changes during pregnancy. The changes vary from woman to woman.   You may gain or lose a couple of pounds at first.  You may feel sick to your stomach (nauseous) and throw up (vomit). If the vomiting is uncontrollable, call your health care provider.  You may tire easily.  You may develop headaches that can be relieved by medicines approved by your health care provider.  You may urinate more often. Painful urination may mean you have a bladder infection.  You may develop heartburn as a result of your pregnancy.  You may develop constipation because certain hormones are causing the muscles that push waste through your intestines to slow down.  You may develop hemorrhoids or swollen, bulging veins (varicose veins).  Your breasts may begin to grow larger and become tender. Your nipples may stick out more, and the tissue that surrounds them (areola) may become darker.  Your gums may bleed and may be sensitive to brushing and flossing.  Dark spots or blotches  (chloasma, mask of pregnancy) may develop on your face. This will likely fade after the baby is born.  Your menstrual periods will stop.  You may have a loss of appetite.  You may develop cravings for certain kinds of food.  You may have changes in your emotions from day to day, such as being excited to be pregnant or being concerned that something may go wrong with the pregnancy and baby.  You may have more vivid and strange dreams.  You may have changes in your hair. These can include thickening of your hair, rapid growth, and changes in texture. Some women also have hair loss during or after pregnancy, or hair that feels dry or thin. Your hair will most likely return to normal after your baby is born. WHAT TO EXPECT AT YOUR PRENATAL VISITS During a routine prenatal visit:  You will be weighed to make sure you and the baby are growing normally.  Your blood pressure will be taken.  Your abdomen will be measured to track your baby's growth.  The fetal heartbeat will be listened to starting around week 10 or 12 of your pregnancy.  Test results from any previous visits will be discussed. Your health care provider may ask you:  How you are feeling.  If you are feeling the baby move.  If you have had any abnormal symptoms, such as leaking fluid, bleeding, severe headaches, or abdominal cramping.  If you are using any tobacco products,   are feeling the baby move.  · If you have had any abnormal symptoms, such as leaking fluid, bleeding, severe headaches, or abdominal cramping.  · If you are using any tobacco products, including cigarettes, chewing tobacco, and electronic cigarettes.  · If you have any questions.  Other tests that may be performed during your first trimester include:  · Blood tests to find your blood type and to check for the presence of any previous infections. They will also be used to check for low iron levels (anemia) and Rh antibodies. Later in the pregnancy, blood tests for diabetes will be done along with other tests if problems develop.  · Urine tests to check for infections, diabetes, or protein in the urine.   · An ultrasound to confirm the proper growth and development of the baby.  · An amniocentesis to check for possible genetic problems.  · Fetal screens for spina bifida and Down syndrome.  · You may need other tests to make sure you and the baby are doing well.  · HIV (human immunodeficiency virus) testing. Routine prenatal testing includes screening for HIV, unless you choose not to have this test.  HOME CARE INSTRUCTIONS   Medicines  · Follow your health care provider's instructions regarding medicine use. Specific medicines may be either safe or unsafe to take during pregnancy.  · Take your prenatal vitamins as directed.  · If you develop constipation, try taking a stool softener if your health care provider approves.  Diet  · Eat regular, well-balanced meals. Choose a variety of foods, such as meat or vegetable-based protein, fish, milk and low-fat dairy products, vegetables, fruits, and whole grain breads and cereals. Your health care provider will help you determine the amount of weight gain that is right for you.  · Avoid raw meat and uncooked cheese. These carry germs that can cause birth defects in the baby.  · Eating four or five small meals rather than three large meals a day may help relieve nausea and vomiting. If you start to feel nauseous, eating a few soda crackers can be helpful. Drinking liquids between meals instead of during meals also seems to help nausea and vomiting.  · If you develop constipation, eat more high-fiber foods, such as fresh vegetables or fruit and whole grains. Drink enough fluids to keep your urine clear or pale yellow.  Activity and Exercise  · Exercise only as directed by your health care provider. Exercising will help you:    Control your weight.    Stay in shape.    Be prepared for labor and delivery.  · Experiencing pain or cramping in the lower abdomen or low back is a good sign that you should stop exercising. Check with your health care provider  before continuing normal exercises.  · Try to avoid standing for long periods of time. Move your legs often if you must stand in one place for a long time.  · Avoid heavy lifting.  · Wear low-heeled shoes, and practice good posture.  · You may continue to have sex unless your health care provider directs you otherwise.  Relief of Pain or Discomfort  · Wear a good support bra for breast tenderness.    · Take warm sitz baths to soothe any pain or discomfort caused by hemorrhoids. Use hemorrhoid cream if your health care provider approves.    · Rest with your legs elevated if you have leg cramps or low back pain.  · If you develop varicose veins in your   legs, wear support hose. Elevate your feet for 15 minutes, 3-4 times a day. Limit salt in your diet.  Prenatal Care  · Schedule your prenatal visits by the twelfth week of pregnancy. They are usually scheduled monthly at first, then more often in the last 2 months before delivery.  · Write down your questions. Take them to your prenatal visits.  · Keep all your prenatal visits as directed by your health care provider.  Safety  · Wear your seat belt at all times when driving.  · Make a list of emergency phone numbers, including numbers for family, friends, the hospital, and police and fire departments.  General Tips  · Ask your health care provider for a referral to a local prenatal education class. Begin classes no later than at the beginning of month 6 of your pregnancy.  · Ask for help if you have counseling or nutritional needs during pregnancy. Your health care provider can offer advice or refer you to specialists for help with various needs.  · Do not use hot tubs, steam rooms, or saunas.  · Do not douche or use tampons or scented sanitary pads.  · Do not cross your legs for long periods of time.  · Avoid cat litter boxes and soil used by cats. These carry germs that can cause birth defects in the baby and possibly loss of the fetus by miscarriage or stillbirth.   · Avoid all smoking, herbs, alcohol, and medicines not prescribed by your health care provider. Chemicals in these affect the formation and growth of the baby.  · Do not use any tobacco products, including cigarettes, chewing tobacco, and electronic cigarettes. If you need help quitting, ask your health care provider. You may receive counseling support and other resources to help you quit.  · Schedule a dentist appointment. At home, brush your teeth with a soft toothbrush and be gentle when you floss.  SEEK MEDICAL CARE IF:   · You have dizziness.  · You have mild pelvic cramps, pelvic pressure, or nagging pain in the abdominal area.  · You have persistent nausea, vomiting, or diarrhea.  · You have a bad smelling vaginal discharge.  · You have pain with urination.  · You notice increased swelling in your face, hands, legs, or ankles.  SEEK IMMEDIATE MEDICAL CARE IF:   · You have a fever.  · You are leaking fluid from your vagina.  · You have spotting or bleeding from your vagina.  · You have severe abdominal cramping or pain.  · You have rapid weight gain or loss.  · You vomit blood or material that looks like coffee grounds.  · You are exposed to [place] measles and have never had them.  · You are exposed to fifth disease or chickenpox.  · You develop a severe headache.  · You have shortness of breath.  · You have any kind of trauma, such as from a fall or a car accident.     This information is not intended to replace advice given to you by your health care provider. Make sure you discuss any questions you have with your health care provider.     Document Released: 07/23/2001 Document Revised: 08/19/2014 Document Reviewed: 06/08/2013  Elsevier Interactive Patient Education ©2017 [employer]

## 2016-07-12 NOTE — Progress Notes (Signed)
Presents with positive home UPT. LMP October 28, normal cycle. Mirena IUD removed 08/2015 has been using condoms. Negative STD screen with partner. Denies vaginal discharge, urinary symptoms, abdominal pain or spotting. Has a 29-year-old son [name] delivered in [place].  Exam: Appears well. UPT positive..  Early pregnancy/4 weeks  Plan: Schedule viability/dating ultrasound after December 15. Safe pregnancy behaviors reviewed. Encouraged prenatal vitamin daily. Aware we no longer deliver will transfer care after viability dating ultrasound. Congratulations given.

## 2016-07-18 ENCOUNTER — Telehealth: Payer: Self-pay | Admitting: *Deleted

## 2016-07-18 NOTE — Telephone Encounter (Signed)
Per Margo ref # [id number] [place] and visit covered at 85/15 co-insurance after $1500 deductible met only $297.89 met therefore pt responsibility is $410.62.  Pt will be informed KW

## 2016-07-22 ENCOUNTER — Other Ambulatory Visit: Payer: Self-pay | Admitting: Women's Health

## 2016-07-22 DIAGNOSIS — Z3491 Encounter for supervision of normal pregnancy, unspecified, first trimester: Secondary | ICD-10-CM

## 2016-07-22 DIAGNOSIS — O3680X0 Pregnancy with inconclusive fetal viability, not applicable or unspecified: Secondary | ICD-10-CM

## 2016-07-31 ENCOUNTER — Other Ambulatory Visit: Payer: Self-pay | Admitting: Women's Health

## 2016-07-31 ENCOUNTER — Encounter: Payer: Self-pay | Admitting: Women's Health

## 2016-07-31 ENCOUNTER — Ambulatory Visit (INDEPENDENT_AMBULATORY_CARE_PROVIDER_SITE_OTHER): Payer: BC Managed Care – PPO | Admitting: Women's Health

## 2016-07-31 ENCOUNTER — Ambulatory Visit (INDEPENDENT_AMBULATORY_CARE_PROVIDER_SITE_OTHER): Payer: BC Managed Care – PPO

## 2016-07-31 VITALS — BP 124/80 | Ht 64.0 in | Wt 133.0 lb

## 2016-07-31 DIAGNOSIS — O3411 Maternal care for benign tumor of corpus uteri, first trimester: Secondary | ICD-10-CM | POA: Diagnosis not present

## 2016-07-31 DIAGNOSIS — O3680X0 Pregnancy with inconclusive fetal viability, not applicable or unspecified: Secondary | ICD-10-CM | POA: Diagnosis not present

## 2016-07-31 DIAGNOSIS — D259 Leiomyoma of uterus, unspecified: Secondary | ICD-10-CM

## 2016-07-31 DIAGNOSIS — N852 Hypertrophy of uterus: Secondary | ICD-10-CM | POA: Diagnosis not present

## 2016-07-31 DIAGNOSIS — Z3491 Encounter for supervision of normal pregnancy, unspecified, first trimester: Secondary | ICD-10-CM

## 2016-07-31 NOTE — Progress Notes (Signed)
Presents for viability/dating ultrasound. Denies abdominal pain, fever, discharge, or bleeding. Having slight urinary pressure but no pain or burning.  Exam: Appears well. Ultrasound: T/V and T/A anteverted uterus living IUP in fundus size equal dates by EGA [redacted] weeks 6 days by LMP, CRL 19 mm 8 weeks 0 days. Fetal heart motion 160 bpm, cervix long and closed, normal yolk sac seen, G/S located between fibroids left 67 x 68 x 70 mm, right 37 x 37 mm, 30 x 26 mm, 44 x 34 mm, 20 x 23 mm, 28 x 20 mm. Right and left ovary normal.  First trimester pregnancy Fibroid uterus  Plan: A copy of ultrasound report given instructed to schedule new OB care. Congratulations given. Encouraged to continue prenatal vitamin daily, safe pregnancy behaviors reviewed. Has sickle cell trait, instructed to have partner tested.

## 2016-07-31 NOTE — Patient Instructions (Signed)
Uterine Fibroids Uterine fibroids are tissue masses (tumors). They are also called leiomyomas. They can develop inside of a woman's womb (uterus). They can grow very large. Fibroids are not cancerous (benign). Most fibroids do not require medical treatment. Follow these instructions at home:  Keep all follow-up visits as told by your doctor. This is important.  Take medicines only as told by your doctor.  If you were prescribed a hormone treatment, take the hormone medicines exactly as told.  Do not take aspirin. It can cause bleeding.  Ask your doctor about taking iron pills and increasing the amount of dark green, leafy vegetables in your diet. These actions can help to boost your blood iron levels.  Pay close attention to your period. Tell your doctor about any changes, such as:  Increased blood flow. This may require you to use more pads or tampons than usual per month.  A change in the number of days that your period lasts per month.  A change in symptoms that come with your period, such as back pain or cramping in your belly area (abdomen). Contact a doctor if:  You have pain in your back or the area between your hip bones (pelvic area) that is not controlled by medicines.  You have pain in your abdomen that is not controlled with medicines.  You have an increase in bleeding between and during periods.  You soak tampons or pads in a half hour or less.  You feel lightheaded.  You feel extra tired.  You feel weak. Get help right away if:  You pass out (faint).  You have a sudden increase in pelvic pain. This information is not intended to replace advice given to you by your health care provider. Make sure you discuss any questions you have with your health care provider. Document Released: 08/31/2010 Document Revised: 03/29/2016 Document Reviewed: 01/25/2014 Elsevier Interactive Patient Education  2017 [place] of Pregnancy The first trimester  of pregnancy is from week 1 until the end of week 12 (months 1 through 3). A week after a sperm fertilizes an egg, the egg will implant on the wall of the uterus. This embryo will begin to develop into a baby. Genes from you and your partner are forming the baby. The female genes determine whether the baby is a boy or a girl. At 6-8 weeks, the eyes and face are formed, and the heartbeat can be seen on ultrasound. At the end of 12 weeks, all the baby's organs are formed.  Now that you are pregnant, you will want to do everything you can to have a healthy baby. Two of the most important things are to get good prenatal care and to follow your health care provider's instructions. Prenatal care is all the medical care you receive before the baby's birth. This care will help prevent, find, and treat any problems during the pregnancy and childbirth. BODY CHANGES Your body goes through many changes during pregnancy. The changes vary from woman to woman.   You may gain or lose a couple of pounds at first.  You may feel sick to your stomach (nauseous) and throw up (vomit). If the vomiting is uncontrollable, call your health care provider.  You may tire easily.  You may develop headaches that can be relieved by medicines approved by your health care provider.  You may urinate more often. Painful urination may mean you have a bladder infection.  You may develop heartburn as a result of your pregnancy.  You may develop constipation because certain hormones are causing the muscles that push waste through your intestines to slow down.  You may develop hemorrhoids or swollen, bulging veins (varicose veins).  Your breasts may begin to grow larger and become tender. Your nipples may stick out more, and the tissue that surrounds them (areola) may become darker.  Your gums may bleed and may be sensitive to brushing and flossing.  Dark spots or blotches (chloasma, mask of pregnancy) may develop on your face. This  will likely fade after the baby is born.  Your menstrual periods will stop.  You may have a loss of appetite.  You may develop cravings for certain kinds of food.  You may have changes in your emotions from day to day, such as being excited to be pregnant or being concerned that something may go wrong with the pregnancy and baby.  You may have more vivid and strange dreams.  You may have changes in your hair. These can include thickening of your hair, rapid growth, and changes in texture. Some women also have hair loss during or after pregnancy, or hair that feels dry or thin. Your hair will most likely return to normal after your baby is born. WHAT TO EXPECT AT YOUR PRENATAL VISITS During a routine prenatal visit:  You will be weighed to make sure you and the baby are growing normally.  Your blood pressure will be taken.  Your abdomen will be measured to track your baby's growth.  The fetal heartbeat will be listened to starting around week 10 or 12 of your pregnancy.  Test results from any previous visits will be discussed. Your health care provider may ask you:  How you are feeling.  If you are feeling the baby move.  If you have had any abnormal symptoms, such as leaking fluid, bleeding, severe headaches, or abdominal cramping.  If you are using any tobacco products, including cigarettes, chewing tobacco, and electronic cigarettes.  If you have any questions. Other tests that may be performed during your first trimester include:  Blood tests to find your blood type and to check for the presence of any previous infections. They will also be used to check for low iron levels (anemia) and Rh antibodies. Later in the pregnancy, blood tests for diabetes will be done along with other tests if problems develop.  Urine tests to check for infections, diabetes, or protein in the urine.  An ultrasound to confirm the proper growth and development of the baby.  An amniocentesis to  check for possible genetic problems.  Fetal screens for spina bifida and Down syndrome.  You may need other tests to make sure you and the baby are doing well.  HIV (human immunodeficiency virus) testing. Routine prenatal testing includes screening for HIV, unless you choose not to have this test. HOME CARE INSTRUCTIONS  Medicines   Follow your health care provider's instructions regarding medicine use. Specific medicines may be either safe or unsafe to take during pregnancy.  Take your prenatal vitamins as directed.  If you develop constipation, try taking a stool softener if your health care provider approves. Diet   Eat regular, well-balanced meals. Choose a variety of foods, such as meat or vegetable-based protein, fish, milk and low-fat dairy products, vegetables, fruits, and whole grain breads and cereals. Your health care provider will help you determine the amount of weight gain that is right for you.  Avoid raw meat and uncooked cheese. These carry germs that can cause  birth defects in the baby.  Eating four or five small meals rather than three large meals a day may help relieve nausea and vomiting. If you start to feel nauseous, eating a few soda crackers can be helpful. Drinking liquids between meals instead of during meals also seems to help nausea and vomiting.  If you develop constipation, eat more high-fiber foods, such as fresh vegetables or fruit and whole grains. Drink enough fluids to keep your urine clear or pale yellow. Activity and Exercise   Exercise only as directed by your health care provider. Exercising will help you:  Control your weight.  Stay in shape.  Be prepared for labor and delivery.  Experiencing pain or cramping in the lower abdomen or low back is a good sign that you should stop exercising. Check with your health care provider before continuing normal exercises.  Try to avoid standing for long periods of time. Move your legs often if you must  stand in one place for a long time.  Avoid heavy lifting.  Wear low-heeled shoes, and practice good posture.  You may continue to have sex unless your health care provider directs you otherwise. Relief of Pain or Discomfort   Wear a good support bra for breast tenderness.   Take warm sitz baths to soothe any pain or discomfort caused by hemorrhoids. Use hemorrhoid cream if your health care provider approves.   Rest with your legs elevated if you have leg cramps or low back pain.  If you develop varicose veins in your legs, wear support hose. Elevate your feet for 15 minutes, 3-4 times a day. Limit salt in your diet. Prenatal Care   Schedule your prenatal visits by the twelfth week of pregnancy. They are usually scheduled monthly at first, then more often in the last 2 months before delivery.  Write down your questions. Take them to your prenatal visits.  Keep all your prenatal visits as directed by your health care provider. Safety   Wear your seat belt at all times when driving.  Make a list of emergency phone numbers, including numbers for family, friends, the hospital, and police and fire departments. General Tips   Ask your health care provider for a referral to a local prenatal education class. Begin classes no later than at the beginning of month 6 of your pregnancy.  Ask for help if you have counseling or nutritional needs during pregnancy. Your health care provider can offer advice or refer you to specialists for help with various needs.  Do not use hot tubs, steam rooms, or saunas.  Do not douche or use tampons or scented sanitary pads.  Do not cross your legs for long periods of time.  Avoid cat litter boxes and soil used by cats. These carry germs that can cause birth defects in the baby and possibly loss of the fetus by miscarriage or stillbirth.  Avoid all smoking, herbs, alcohol, and medicines not prescribed by your health care provider. Chemicals in these  affect the formation and growth of the baby.  Do not use any tobacco products, including cigarettes, chewing tobacco, and electronic cigarettes. If you need help quitting, ask your health care provider. You may receive counseling support and other resources to help you quit.  Schedule a dentist appointment. At home, brush your teeth with a soft toothbrush and be gentle when you floss. SEEK MEDICAL CARE IF:   You have dizziness.  You have mild pelvic cramps, pelvic pressure, or nagging pain in the abdominal  area.  You have persistent nausea, vomiting, or diarrhea.  You have a bad smelling vaginal discharge.  You have pain with urination.  You notice increased swelling in your face, hands, legs, or ankles. SEEK IMMEDIATE MEDICAL CARE IF:   You have a fever.  You are leaking fluid from your vagina.  You have spotting or bleeding from your vagina.  You have severe abdominal cramping or pain.  You have rapid weight gain or loss.  You vomit blood or material that looks like coffee grounds.  You are exposed to [place] measles and have never had them.  You are exposed to fifth disease or chickenpox.  You develop a severe headache.  You have shortness of breath.  You have any kind of trauma, such as from a fall or a car accident. This information is not intended to replace advice given to you by your health care provider. Make sure you discuss any questions you have with your health care provider. Document Released: 07/23/2001 Document Revised: 08/19/2014 Document Reviewed: 06/08/2013 Elsevier Interactive Patient Education  2017 [employer].

## 2016-08-12 NOTE — L&D Delivery Note (Addendum)
Delivery Note  First Stage: Labor onset: 02/22/17 @ 9:30pm Augmentation : AROM Analgesia /Anesthesia intrapartum: none AROM at 0850 - clear fluid  Second Stage: Complete dilation at 0920am  Onset of pushing at: 0920 FHR second stage Category 2 Water temp: 98.55F Total time in tub: 3 minutes  Spontaneous vaginal water birth delivery of a viable female "[place]" at 0922 by Lars Pinks, CNM in ROA position No nuchal cord Cord double clamped after cessation of pulsation, cut by FOB Cord blood sample collected   Third Stage: Placenta delivered via Delena Bali intact with 3 VC @ 0927am Placenta disposition: hospital disposal Uterine tone firm with massage / bleeding moderate, but slowed with fundal massage and IV Pitocin 552mL bolus Large uterine fibroid palpated   2nd degree perineal laceration identified  Anesthesia for repair: 1% Lidocaine 56mL Repair 3.0 vicryl CT Est. Blood Loss (mL): 407WK  Complications: none  Mom to postpartum.  Baby to Couplet care / Skin to Skin.  Newborn: Birth Weight: pending  Apgar Scores: 8, 9 Feeding planned: Breast  Lars Pinks, MSN, CNM [facility]

## 2016-12-25 ENCOUNTER — Encounter: Payer: Self-pay | Admitting: Gynecology

## 2017-02-23 ENCOUNTER — Inpatient Hospital Stay (HOSPITAL_COMMUNITY)
Admission: AD | Admit: 2017-02-23 | Discharge: 2017-02-25 | DRG: 775 | Disposition: A | Discharge status: Home / Self Care | Payer: BC Managed Care – PPO | Source: Ambulatory Visit | Attending: Obstetrics and Gynecology | Admitting: Obstetrics and Gynecology

## 2017-02-23 ENCOUNTER — Encounter (HOSPITAL_COMMUNITY): Payer: Self-pay | Admitting: *Deleted

## 2017-02-23 DIAGNOSIS — O2243 Hemorrhoids in pregnancy, third trimester: Secondary | ICD-10-CM | POA: Diagnosis present

## 2017-02-23 DIAGNOSIS — O3413 Maternal care for benign tumor of corpus uteri, third trimester: Secondary | ICD-10-CM | POA: Diagnosis present

## 2017-02-23 DIAGNOSIS — D259 Leiomyoma of uterus, unspecified: Secondary | ICD-10-CM | POA: Diagnosis present

## 2017-02-23 DIAGNOSIS — D573 Sickle-cell trait: Secondary | ICD-10-CM | POA: Diagnosis present

## 2017-02-23 DIAGNOSIS — Z3A37 37 weeks gestation of pregnancy: Secondary | ICD-10-CM

## 2017-02-23 DIAGNOSIS — O9902 Anemia complicating childbirth: Principal | ICD-10-CM | POA: Diagnosis present

## 2017-02-23 DIAGNOSIS — Z3493 Encounter for supervision of normal pregnancy, unspecified, third trimester: Secondary | ICD-10-CM | POA: Diagnosis present

## 2017-02-23 LAB — CBC
HEMATOCRIT: 35.4 % — AB (ref 36.0–46.0)
HEMOGLOBIN: 12.2 g/dL (ref 12.0–15.0)
MCH: 27.6 pg (ref 26.0–34.0)
MCHC: 34.5 g/dL (ref 30.0–36.0)
MCV: 80.1 fL (ref 78.0–100.0)
Platelets: 265 10*3/uL (ref 150–400)
RBC: 4.42 MIL/uL (ref 3.87–5.11)
RDW: 14.2 % (ref 11.5–15.5)
WBC: 13.1 10*3/uL — ABNORMAL HIGH (ref 4.0–10.5)

## 2017-02-23 LAB — TYPE AND SCREEN
ABO/RH(D): B POS
ANTIBODY SCREEN: NEGATIVE

## 2017-02-23 LAB — ABO/RH: ABO/RH(D): B POS

## 2017-02-23 MED ORDER — PRENATAL MULTIVITAMIN CH
1.0000 | ORAL_TABLET | Freq: Every day | ORAL | Status: DC
  Administered 2017-02-23 – 2017-02-25 (×3): 1 via ORAL
  Filled 2017-02-23 (×3): qty 1

## 2017-02-23 MED ORDER — COCONUT OIL OIL: 1.0000 "application " | TOPICAL_OIL | Status: DC | PRN

## 2017-02-23 MED ORDER — SOD CITRATE-CITRIC ACID 500-334 MG/5ML PO SOLN: 30.0000 mL | ORAL | Status: DC | PRN

## 2017-02-23 MED ORDER — SODIUM CHLORIDE 0.9 % IV SOLN: 250.0000 mL | INTRAVENOUS | Status: DC | PRN

## 2017-02-23 MED ORDER — DIBUCAINE 1 % RE OINT
1.0000 "application " | TOPICAL_OINTMENT | RECTAL | Status: DC | PRN
  Administered 2017-02-23: 1 via RECTAL
  Filled 2017-02-23: qty 28

## 2017-02-23 MED ORDER — LACTATED RINGERS IV SOLN: 500.0000 mL | INTRAVENOUS | Status: DC | PRN

## 2017-02-23 MED ORDER — ZOLPIDEM TARTRATE 5 MG PO TABS: 5.0000 mg | ORAL_TABLET | Freq: Every evening | ORAL | Status: DC | PRN

## 2017-02-23 MED ORDER — WITCH HAZEL-GLYCERIN EX PADS
1.0000 "application " | MEDICATED_PAD | CUTANEOUS | Status: DC | PRN
  Administered 2017-02-23: 1 via TOPICAL

## 2017-02-23 MED ORDER — OXYTOCIN BOLUS FROM INFUSION
500.0000 mL | Freq: Once | INTRAVENOUS | Status: AC
  Administered 2017-02-23: 500 mL via INTRAVENOUS

## 2017-02-23 MED ORDER — ACETAMINOPHEN 325 MG PO TABS: 650.0000 mg | ORAL_TABLET | ORAL | Status: DC | PRN

## 2017-02-23 MED ORDER — LIDOCAINE HCL (PF) 1 % IJ SOLN
30.0000 mL | INTRAMUSCULAR | Status: DC | PRN
  Administered 2017-02-23: 30 mL via SUBCUTANEOUS
  Filled 2017-02-23: qty 30

## 2017-02-23 MED ORDER — DIPHENHYDRAMINE HCL 25 MG PO CAPS: 25.0000 mg | ORAL_CAPSULE | Freq: Four times a day (QID) | ORAL | Status: DC | PRN

## 2017-02-23 MED ORDER — SIMETHICONE 80 MG PO CHEW: 80.0000 mg | CHEWABLE_TABLET | ORAL | Status: DC | PRN

## 2017-02-23 MED ORDER — FENTANYL CITRATE (PF) 100 MCG/2ML IJ SOLN
INTRAMUSCULAR | Status: AC
  Administered 2017-02-23: 50 ug via INTRAVENOUS
  Filled 2017-02-23: qty 2

## 2017-02-23 MED ORDER — IBUPROFEN 600 MG PO TABS
600.0000 mg | ORAL_TABLET | Freq: Four times a day (QID) | ORAL | Status: DC
  Administered 2017-02-23 – 2017-02-25 (×9): 600 mg via ORAL
  Filled 2017-02-23 (×9): qty 1

## 2017-02-23 MED ORDER — BENZOCAINE-MENTHOL 20-0.5 % EX AERO
1.0000 "application " | INHALATION_SPRAY | CUTANEOUS | Status: DC | PRN
  Administered 2017-02-23: 1 via TOPICAL
  Filled 2017-02-23 (×2): qty 56

## 2017-02-23 MED ORDER — FENTANYL CITRATE (PF) 100 MCG/2ML IJ SOLN
50.0000 ug | Freq: Once | INTRAMUSCULAR | Status: AC
  Administered 2017-02-23: 50 ug via INTRAVENOUS

## 2017-02-23 MED ORDER — ONDANSETRON HCL 4 MG/2ML IJ SOLN: 4.0000 mg | INTRAMUSCULAR | Status: DC | PRN

## 2017-02-23 MED ORDER — ONDANSETRON HCL 4 MG PO TABS: 4.0000 mg | ORAL_TABLET | ORAL | Status: DC | PRN

## 2017-02-23 MED ORDER — SODIUM CHLORIDE 0.9% FLUSH: 3.0000 mL | INTRAVENOUS | Status: DC | PRN

## 2017-02-23 MED ORDER — SODIUM CHLORIDE 0.9% FLUSH
3.0000 mL | Freq: Two times a day (BID) | INTRAVENOUS | Status: DC
  Administered 2017-02-23: 3 mL via INTRAVENOUS

## 2017-02-23 MED ORDER — OXYTOCIN 40 UNITS IN LACTATED RINGERS INFUSION - SIMPLE MED
2.5000 [IU]/h | INTRAVENOUS | Status: DC
  Administered 2017-02-23: 2.5 [IU]/h via INTRAVENOUS
  Filled 2017-02-23: qty 1000

## 2017-02-23 MED ORDER — ONDANSETRON HCL 4 MG/2ML IJ SOLN
4.0000 mg | Freq: Four times a day (QID) | INTRAMUSCULAR | Status: DC | PRN
  Administered 2017-02-23: 4 mg via INTRAVENOUS
  Filled 2017-02-23: qty 2

## 2017-02-23 MED ORDER — SENNOSIDES-DOCUSATE SODIUM 8.6-50 MG PO TABS
2.0000 | ORAL_TABLET | ORAL | Status: DC
  Administered 2017-02-24 (×2): 2 via ORAL
  Filled 2017-02-23 (×2): qty 2

## 2017-02-23 NOTE — Lactation Note (Signed)
This note was copied from a baby's chart. Lactation Consultation Note  P2, 42 4 hours old. Ex BF for 1 year.  Baby has breast x 2 since birth. Mother demonstrated hand expression with good flow.  Nipples evert with stimulation. Mother denies questions or concerns. Discussed basics. Mom encouraged to feed baby 8-12 times/24 hours and with feeding cues.  Mom made aware of O/P services, breastfeeding support groups, community resources, and our phone # for post-discharge questions.    Patient Name: Kim Alvarez Today's Date: 02/23/2017     Maternal Data    Feeding Feeding Type: Breast Fed Length of feed: 30 min  LATCH Score/Interventions Latch: Repeated attempts needed to sustain latch, nipple held in mouth throughout feeding, stimulation needed to elicit sucking reflex. Intervention(s): Adjust position;Assist with latch  Audible Swallowing: A few with stimulation  Type of Nipple: Everted at rest and after stimulation  Comfort (Breast/Nipple): Soft / non-tender     Hold (Positioning): Assistance needed to correctly position infant at breast and maintain latch. Intervention(s): Breastfeeding basics reviewed;Support Pillows;Position options;Skin to skin  LATCH Score: 7  Lactation Tools Discussed/Used     Consult Status      Vivianne Master [place] 02/23/2017, 1:36 PM

## 2017-02-23 NOTE — MAU Note (Signed)
PT SAYS SHE STARTED UC'S AT   12 MN      VE - 2-3 CM  Thursday.   DENIES HSV AND  MRSA.  GBS- UNSURE

## 2017-02-23 NOTE — H&P (Signed)
OB ADMISSION/ HISTORY & PHYSICAL:  Admission Date: 02/23/2017  1:15 AM  Admit Diagnosis: Labor at 37+[redacted] weeks gestation  Kim Alvarez is a 31 y.o. female [phone] presenting for labor contractions at 37+3 weeks. She called on call midwife at 10pm stating she lost her mucus plug at 7:30pm and contractions started around 9:30pm, and have progressively gotten stronger.  She was triaged in MAU for 1.5 hrs, and states her contractions are much stronger and is breathing and moving through them. She denies VB, LOF.  She endorses good fetal movement.  She is planning a water birth.   Prenatal History: [id number]   EDC : 03/13/2017, Date entered prior to episode creation  Prenatal care at [facility] since 1st trimester Primary Ob Provider: Dr. Garwin Brothers, then transferred to the midwives for water birth at 67 weeks Prenatal course complicated by: - Hx. Of Preterm delivery at 26 weeks s/p 17-OHP - Sickle Cell Trait Positive  - Anterior uterine fibroid 5.7cm - External thrombosed hemorrhoid   Prenatal Labs: ABO, Rh:  B Positive Antibody:  Negative Rubella:   Immune RPR:   NR HBsAg:   Negative HIV:   NR GTT: 132 GBS:   Negative Sickle Cell Trait - positive   Medical / Surgical History :  Past medical history:  Past Medical History:  Diagnosis Date  . Abnormal Pap smear 2007  . Allergy   . Chlamydia 2013   treated  . Family history of sickle cell anemia (Hgb SS) in mother   . FHx: cancer   . H/O varicella   . IUD (intrauterine device) in place 08/2010  . Sickle cell trait ([place])   . Yeast infection      Past surgical history: History reviewed. No pertinent surgical history.  Family History:  Family History  Problem Relation Age of Onset  . Sickle cell anemia Mother        alive  . Sickle cell anemia Maternal Aunt        alive  . Breast cancer Paternal Aunt   . Sickle cell anemia Maternal Uncle        alive  . Sickle cell anemia Maternal Aunt        died  . Sickle cell  anemia Maternal Uncle        died     Social History:  reports that she has never smoked. She has never used smokeless tobacco. She reports that she drinks alcohol. She reports that she does not use drugs.   Allergies: Patient has no known allergies.    Current Medications at time of admission:  Prior to Admission medications   Not on File     Review of Systems: Active FM onset of ctx @ 02/23/17 9:30pm currently every 2-5 minutes No LOF  / SROM  No bloody show  Nausea, vomiting x 1   Physical Exam:  VS: Blood pressure 129/75, pulse 95, temperature 98.5 F (36.9 C), temperature source Oral, resp. rate 20, height 5\' 4"  (1.626 m), weight 75.3 kg (166 lb), last menstrual period 06/06/2016.  General: alert and oriented, appears  Heart: RRR Lungs: Clear lung fields Abdomen: Gravid, soft and non-tender, non-distended / uterus: gravid, non-tender Extremities: no edema  Genitalia / VE: Dilation: 4.5 Effacement (%): 90 Station: 0 Exam by:: MEREDITH, CNM  FHR: baseline rate 135 bpm / variability moderate / accelerations + / no decelerations TOCO: 3-5 minutes/ moderate-strong  Assessment: 37+[redacted] weeks gestation Latent stage of labor FHR category 1   Plan:  1. Admit to [facility]   - Routine labor and delivery orders   - Pain management: water birth, nitrous oxide PRN   - Saline lock PIV   - Intermittent fetal monitoring per protocol for water birth  2. GBS Negative     - No prophylaxis indicated 3. Postpartum:   - Breast feeding   - Contraception: unsure 4. Anticipate MOD: water birth   - Proven pelvis: PTD at 69 weeks   Dr. Ronita Hipps notified of admission / plan of care  Lars Pinks, MSN, CNM [facility]

## 2017-02-23 NOTE — Anesthesia Pain Management Evaluation Note (Signed)
  CRNA Pain Management Visit Note  Patient: Kim Alvarez, 31 y.o., female  "Hello I am a member of the anesthesia team at [facility]. We have an anesthesia team available at all times to provide care throughout the hospital, including epidural management and anesthesia for C-section. I don't know your plan for the delivery whether it a natural birth, water birth, IV sedation, nitrous supplementation, doula or epidural, but we want to meet your pain goals."   1.Was your pain managed to your expectations on prior hospitalizations?   Yes   2.What is your expectation for pain management during this hospitalization?     Water tub  3.How can we help you reach that goal? Support prn  Record the patient's initial score and the patient's pain goal.   Pain: 6  Pain Goal: 9 The [facility] wants you to be able to say your pain was always managed very well.  [facility] 02/23/2017

## 2017-02-23 NOTE — Progress Notes (Signed)
S:  Water birth consent reviewed and signed by patient and myself      Pt. More uncomfortable and ready to get into pool       Husband and friend are managing tub   O:  VS: Blood pressure 129/75, pulse 95, temperature 97.6 F (36.4 C), temperature source Oral, resp. rate 20, height 5\' 4"  (1.626 m), weight 75.3 kg (166 lb), last menstrual period 06/06/2016.        FHR : baseline 145-150 bpm / variability moderate / accelerations + / early decelerations        Toco: contractions every 2-4 minutes / moderate         Cervix : Dilation: 6 Effacement (%): 90 Station: 0, -1 Presentation: Vertex Exam by:: M. Mervin Ramires CNM        Membranes: Intact, BBOW  A: Active labor     FHR category 1  P: Okay to get into tub      Reviewed tub water temperature guidelines with support persons managing tub      Intermittent FHR per water birth protocol       RN to call CNM when entering into tub        Lars Pinks, MSN, CNM [facility]

## 2017-02-23 NOTE — Progress Notes (Signed)
S: Pt. Only in tub for 12 minutes then decided she wanted to get out and rest      Rates ctxs 7/10, but states they have slowed      Had episode of n/v x 2 and received Zofran      Considering getting back in tub closer to delivery  O:  VS: Blood pressure 108/60, pulse 95, temperature 98 F (36.7 C), temperature source Axillary, resp. rate 18, height 5\' 4"  (1.626 m), weight 75.3 kg (166 lb), last menstrual period 06/06/2016.        FHR : baseline 130 bpm/ variability moderate / accelerations + / no decelerations        Toco: contractions every 4-5 minutes, difficulty tracing on monitor / moderate        Cervix : deferred        Membranes: intact, BBOW  A: Active labor     FHR category 1  P: May rest now      Discussed if she decides to get back in the tub, we need to recheck temperature, and likely partially drain and refill tub       Advised to call out when considering next intervention after rest     Reassess in 1-2 hours     Lars Pinks, MSN, CNM [facility]

## 2017-02-24 LAB — CBC
HEMATOCRIT: 30.2 % — AB (ref 36.0–46.0)
Hemoglobin: 10.3 g/dL — ABNORMAL LOW (ref 12.0–15.0)
MCH: 27.3 pg (ref 26.0–34.0)
MCHC: 34.1 g/dL (ref 30.0–36.0)
MCV: 80.1 fL (ref 78.0–100.0)
PLATELETS: 215 10*3/uL (ref 150–400)
RBC: 3.77 MIL/uL — AB (ref 3.87–5.11)
RDW: 14.4 % (ref 11.5–15.5)
WBC: 16.3 10*3/uL — ABNORMAL HIGH (ref 4.0–10.5)

## 2017-02-24 NOTE — Progress Notes (Signed)
CSW met with MOB in room 126 to complete an assessment for ETOH use during pregnancy.  When CSW arrived, MOB was in bed attaching and bonding with infant as evident by engaging in skin to skin.  MOB expressed feelings of happiness about being a mom again and appeared comfortable and happy with infant.  CSW inquired about MOB's ETOH use during pregnancy, and MOB appeared puzzled.  MOB repeatedly reported that MOB was a social drinker prior to pregnancy confirmation and denied the use of any other substance.  CSW offered MOB resources for SA and MOB declined and reported that MOB does not have a SA problem.  MOB reported having all necessary items for infant and feeling prepared to parent. MOB denied having any psychosocial stressors.  There are no barriers to d/c.   Daily Crate Boyd-Gilyard, MSW, LCSW Clinical Social Work [phone]  

## 2017-02-24 NOTE — Progress Notes (Signed)
PPD 1 SVD/waterbirth with 2nd degree repair  S:  Reports feeling well - just tired             Tolerating po/ No nausea or vomiting             Bleeding is moderate             Pain controlled with motrin             Up ad lib / ambulatory / voiding QS  Newborn breast feeding / difficulty with latch  O:  VS: BP 99/65 (BP Location: Right Arm)   Pulse 66   Temp 98.1 F (36.7 C) (Oral)   Resp 18   Ht 5\' 4"  (1.626 m)   Wt 75.3 kg (166 lb)   LMP 06/06/2016   BMI 28.49 kg/m          Recent Labs  02/23/17 0430 02/24/17 0553  WBC 13.1* 16.3*  HGB 12.2 10.3*  PLT 265 215               Blood type: --/--/B POS, B POS (07/15 0430)                   Physical Exam:             Alert and oriented X3  Abdomen: soft, non-tender, non-distended              Fundus: firm, non-tender, U-1  Perineum: no edema  Lochia: light  Extremities: no edema, no calf pain or tenderness    A: PPD # 1 SVD with 2nd degree repair   Doing well - stable status  P: Routine post partum orders  Anticipate DC tomorrow             Lactation assistance today  Artelia Laroche CNM, MSN, [place] 02/24/2017, 8:20 AM

## 2017-02-24 NOTE — Progress Notes (Signed)
CSW acknowledges consult.  CSW attempted to meet with MOB, however MOB was asleep.  CSW will attempt to visit with MOB at a later time.   Arsal Tappan Boyd-Gilyard, MSW, LCSW Clinical Social Work [phone]  

## 2017-02-24 NOTE — Lactation Note (Signed)
This note was copied from a baby's chart. Lactation Consultation Note  Patient Name: Kim Alvarez [id number] Date: 02/24/2017 Reason for consult: Follow-up assessment  Visited with 2nd time Mom, baby 20 hrs old.  Mom asleep with baby STS on her chest.  Started to take baby to put him in crib, and Mom woke up.   Offered to assist with feeding.   Breasts are full, but soft to palpation.  Hand expression demonstrated, transitional milk easily expressed.  Baby positioned in football hold on left side.  Demonstrated how to sandwich breast to facilitate a deeper latch.  Baby vigorous and multiple swallowing identified. Encouraged Mom to keep baby STS, and feed often on cue.  Goal is 8-12 feedings per 24 hrs. Mom to call for help, and Lactation to follow up in am.   Consult Status Consult Status: Follow-up Date: 02/25/17 Follow-up type: In-patient    Broadus John 02/24/2017, 12:29 PM

## 2017-02-25 LAB — RPR: RPR Ser Ql: NONREACTIVE

## 2017-02-25 MED ORDER — IBUPROFEN 600 MG PO TABS: 600.0000 mg | ORAL_TABLET | Freq: Four times a day (QID) | ORAL | 0 refills | Status: DC

## 2017-02-25 NOTE — Lactation Note (Signed)
This note was copied from a baby's chart. Lactation Consultation Note  Patient Name: Kim Alvarez [id number] Date: 02/25/2017 Reason for consult: Follow-up assessment  Infant is nursing very well. Mother's milk has come to volume. Mom has hand pump to pump for comfort, as needed. Mom was shown how to use the hand pump. On visual examination, a size 24 flange appears appropriate at this time.   Mom had been complaining of a bit of discomfort with initial latch. She was observed to be using a more "bulls-eye" approach w/latching. Specifics of an asymmetric latch shown via [employer] & Mom was able to return demonstration.   I have no concerns about this dyad.  Matthias Hughs [facility] 02/25/2017, 11:13 AM

## 2017-02-25 NOTE — Progress Notes (Signed)
PPD #1, SVD/water birth with 2nd degree laceration, baby boy "Issac"  S:  Reports feeling tired, but overall good and ready to go home today - minimal perineal discomfort             Tolerating po/ No nausea or vomiting / Denies dizziness or SOB             Bleeding is light             Pain controlled with Motrin             Up ad lib / ambulatory / voiding QS  Newborn breast feeding - going well, latching better today, cluster feeding / Circumcision - done yesterday  O:               VS: BP (!) 91/50 (BP Location: Right Arm)   Pulse 70   Temp 98.4 F (36.9 C) (Oral)   Resp 20   Ht 5\' 4"  (1.626 m)   Wt 75.3 kg (166 lb)   LMP 06/06/2016   BMI 28.49 kg/m     LABS:              Recent Labs  02/23/17 0430 02/24/17 0553  WBC 13.1* 16.3*  HGB 12.2 10.3*  PLT 265 215               Blood type: --/--/B POS, B POS (07/15 0430)  Rubella:       Immune                          Physical Exam:             Alert and oriented X3  Lungs: Clear and unlabored  Heart: regular rate and rhythm / no mumurs  Abdomen: soft, non-tender, non-distended              Fundus: firm, non-tender, U -1, fibroids palpated at +1 - mild tenderness when palpating fibroid   Perineum: well approximated 2nd degree lac healing well, no significant edema or erythema  Rectum: external hemorrhoid, small, no evidence of thrombosis   Lochia: appropriate, no clots   Extremities: no edema, no calf pain or tenderness    A: PPD # 1, SVD  2nd degree laceration  Mild ABL Anemia    Uterine Fibroids   Sickle Cell Trait Positive   Doing well - stable status  P: Routine post partum orders  Discharge home today   WOB discharge book and instructions reviewed  F/U in 6 weeks for PP visit   Lars Pinks, MSN, CNM [facility]

## 2017-02-25 NOTE — Discharge Summary (Signed)
Obstetric Discharge Summary   Patient Name: Kim Alvarez DOB: October 22, 1985 MRN: [id number]  Date of Admission: 02/23/2017 Date of Discharge: 02/25/2017 Date of Delivery: 02/23/17 Gestational Age at Delivery: [redacted]w[redacted]d  Primary OB: [facility] - Dr. Garwin Brothers, then transferred to Lars Pinks, CNM for water birth at 27 weeks  Antepartum complications:  - Hx. Of Preterm delivery at 26 weeks s/p 17-OHP - Sickle Cell Trait Positive  - Anterior uterine fibroid 5.7cm, with multiple smaller fibroids - External thrombosed hemorrhoid   Prenatal Labs:  ABO, Rh:  B Positive Antibody:  Negative Rubella:   Immune RPR:   NR HBsAg:   Negative HIV:   NR GTT: 132 GBS:   Negative Sickle Cell Trait - positive   Admitting Diagnosis: Labor at term   Secondary Diagnoses: Patient Active Problem List   Diagnosis Date Noted  . Labor and delivery, indication for care 02/23/2017  . Postpartum care following water birth vaginal delivery (7/15) 02/23/2017  . SVD (spontaneous vaginal delivery) : waterbirth  02/23/2017  . Second-degree perineal laceration, with delivery 02/23/2017  . Sickle cell trait ([place])   . Chlamydia   . Goiter, nontoxic, multinodular   . Abnormal Pap smear   . Yeast infection   . Fibroids     Augmentation: AROM Complications: None  Date of Delivery: 02/23/17 Delivered By: Lars Pinks, CNM  Delivery Type: Spontaneous vaginal delivery/ water birth Anesthesia: none Placenta: sponatneous Laceration: Small 2nd degree  Episiotomy: none  Newborn Data: Live born female  Birth Weight: 6 lb 9.1 oz (2980 g) APGAR: 8, 9  Postpartum Course  (Vaginal Delivery): Patient had an uncomplicated postpartum course.  By time of discharge on PPD#2, her pain was controlled on oral pain medications; she had appropriate lochia and was ambulating, voiding without difficulty and tolerating regular diet.  She was deemed stable for discharge to home.    Labs: CBC Latest Ref Rng & Units  02/24/2017 02/23/2017 01/28/2014  WBC 4.0 - 10.5 K/uL 16.3(H) 13.1(H) 6.0  Hemoglobin 12.0 - 15.0 g/dL 10.3(L) 12.2 13.1  Hematocrit 36.0 - 46.0 % 30.2(L) 35.4(L) 39.2  Platelets 150 - 400 K/uL 215 265 264   B POS  Physical exam:  BP (!) 91/50 (BP Location: Right Arm)   Pulse 70   Temp 98.4 F (36.9 C) (Oral)   Resp 20   Ht 5\' 4"  (1.626 m)   Wt 75.3 kg (166 lb)   LMP 06/06/2016   BMI 28.49 kg/m  General: alert and no distress Pulm: normal respiratory effort Lochia: appropriate Abdomen: soft, NT Uterine Fundus: firm, below umbilicus Perineum: 2nd degree laceration  healing well, no significant erythema, no significant edema Extremities: No evidence of DVT seen on physical exam. No lower extremity edema.   Disposition: stable, discharge to home Baby Feeding: breast milk Baby Disposition: home with mom  Contraception: unsure, but planning   Rh Immune globulin given: N/A Rubella vaccine given: N/A Tdap vaccine given in AP or PP setting: UTD Flu vaccine given in AP or PP setting: N/A   Plan:  [name] was discharged to home in good condition. Follow-up appointment at [facility] in 6 weeks.  Discharge Instructions: Per After Visit Summary. Activity: Advance as tolerated. Pelvic rest for 6 weeks.  Refer to After Visit Summary Diet: Regular Discharge Medications: Allergies as of 02/25/2017   No Known Allergies     Medication List    TAKE these medications   ibuprofen 600 MG tablet Commonly known as:  ADVIL,MOTRIN Take  1 tablet (600 mg total) by mouth every 6 (six) hours.   prenatal multivitamin Tabs tablet Take 1 tablet by mouth every evening.      Outpatient follow up:  Follow-up Information    Darliss Cheney, CNM. Schedule an appointment as soon as possible for a visit in 6 week(s).   Specialty:  Obstetrics and Gynecology Why:  Postpartum visit  Contact information: [place] [address] [phone]             Signed:  Lars Pinks, MSN, CNM [facility]

## 2017-02-26 ENCOUNTER — Telehealth (HOSPITAL_COMMUNITY): Payer: Self-pay | Admitting: Lactation Services

## 2017-02-26 NOTE — Telephone Encounter (Signed)
Mom called in stating that breasts are engorged and baby is not able to soften her breast, but is otherwise nursing very well. Discussed engorgement treatment methods. Enc mom to nurse often and call back with any additional needs.

## 2017-06-16 ENCOUNTER — Encounter (HOSPITAL_COMMUNITY): Payer: Self-pay

## 2017-07-14 ENCOUNTER — Encounter: Payer: BC Managed Care – PPO | Admitting: Women's Health

## 2017-07-14 DIAGNOSIS — Z0289 Encounter for other administrative examinations: Secondary | ICD-10-CM

## 2017-10-07 LAB — OB RESULTS CONSOLE GBS: GBS: NEGATIVE

## 2018-03-30 LAB — OB RESULTS CONSOLE ABO/RH: RH Type: POSITIVE

## 2018-03-30 LAB — OB RESULTS CONSOLE ANTIBODY SCREEN: Antibody Screen: NEGATIVE

## 2018-03-30 LAB — OB RESULTS CONSOLE HEPATITIS B SURFACE ANTIGEN: Hepatitis B Surface Ag: NEGATIVE

## 2018-03-30 LAB — OB RESULTS CONSOLE GC/CHLAMYDIA
CHLAMYDIA, DNA PROBE: NEGATIVE
Gonorrhea: NEGATIVE

## 2018-03-30 LAB — OB RESULTS CONSOLE RUBELLA ANTIBODY, IGM: Rubella: IMMUNE

## 2018-03-30 LAB — OB RESULTS CONSOLE HIV ANTIBODY (ROUTINE TESTING): HIV: NONREACTIVE

## 2018-03-30 LAB — OB RESULTS CONSOLE RPR: RPR: NONREACTIVE

## 2018-08-12 NOTE — L&D Delivery Note (Signed)
Delivery Note:   Elective IOL at term, advanced dilation Pitocin and AROM Pitocin off after AROM Patient into tub at 1310  Complete dilation at 1522 11/03/2018  Onset of pushing at 1522 FHR second stage category 1  Analgesia /Anesthesia intrapartum:None   Delivery of a Live born female under water, baby brought out of water by mother to her chest, strong cry with stimulation, vigorous baby. Birth Weight:  pending APGAR: 8, 9  Newborn Delivery   Birth date/time:  11/03/2018 15:28:00 Delivery type:  Vaginal, Spontaneous     Nuchal Cord: No  Cord double clamped after cessation of pulsation, cut by FOB.  Collection of cord blood donation-None  Arterial cord blood sample-No    Patient assisted to bed for placenta delivery Placenta delivered-Spontaneous , complete, schultz with 3 vessels . Placenta to L&D for disposal. Uterine tone firm, bleeding small   Labial Laceration:None  Repair:None  Est. Blood Loss (JQ):964.38   Complications: ,Other Labor Complications:  Mom to postpartum.  Baby to Couplet care / Skin to Skin.  Delivery Report:  Review the Delivery Report for details.     Signed: Juliene Pina, CNM, MSN 11/03/2018, 3:58 PM

## 2018-09-30 LAB — OB RESULTS CONSOLE GBS: GBS: NEGATIVE

## 2018-10-24 ENCOUNTER — Encounter (HOSPITAL_COMMUNITY): Payer: Self-pay

## 2018-10-24 ENCOUNTER — Inpatient Hospital Stay (HOSPITAL_COMMUNITY)
Admission: AD | Admit: 2018-10-24 | Discharge: 2018-10-24 | Disposition: A | Discharge status: Home / Self Care | Payer: BC Managed Care – PPO | Attending: Obstetrics and Gynecology | Admitting: Obstetrics and Gynecology

## 2018-10-24 ENCOUNTER — Other Ambulatory Visit: Payer: Self-pay

## 2018-10-24 DIAGNOSIS — O471 False labor at or after 37 completed weeks of gestation: Secondary | ICD-10-CM | POA: Insufficient documentation

## 2018-10-24 DIAGNOSIS — O479 False labor, unspecified: Secondary | ICD-10-CM

## 2018-10-24 DIAGNOSIS — Z3A39 39 weeks gestation of pregnancy: Secondary | ICD-10-CM | POA: Insufficient documentation

## 2018-10-24 LAB — POCT FERN TEST: POCT Fern Test: NEGATIVE

## 2018-10-24 NOTE — MAU Note (Addendum)
I have communicated with Artelia Laroche CNM and reviewed vital signs:   Vitals:   10/24/18 2211 10/24/18 2256  BP: 129/82 108/70  Pulse: (!) 108 (!) 108  Resp:  16  Temp:    SpO2:  100%    Vaginal exam:  Dilation: 4 Effacement (%): 70 Cervical Position: Posterior Station: -2 Presentation: Vertex Exam by:: Gilmer Mor RN,   Also reviewed contraction pattern and that non-stress test is reactive.  It has been documented that patient is contracting every 3-7 minutes with minimal cervical change since cervical check in office on 10/19/18 not indicating active labor.  Patient denies any other complaints.  Based on this report provider has given order for discharge.  A discharge order and diagnosis entered by a provider.   Labor discharge instructions reviewed with patient.

## 2018-10-24 NOTE — MAU Provider Note (Signed)
None      S: Ms. HADASA GASNER is a 33 y.o. [phone] at [redacted]w[redacted]d  who presents to MAU today complaining contractions minutes since O: BP 129/82 (BP Location: Right Arm)   Pulse (!) 108   Temp 98.2 F (36.8 C) (Oral)   Resp 18   Ht 5\' 4"  (1.626 m)   Wt 82.6 kg   SpO2 100%   BMI 31.26 kg/m  GENERAL: Well-developed, well-nourished female in no acute distress.  HEAD: Normocephalic, atraumatic.  CHEST: Normal effort of breathing, regular heart rate ABDOMEN: Soft, nontender, gravid  Cervical exam:  Dilation: 4 Effacement (%): 70 Cervical Position: Posterior Station: -2 Presentation: Vertex Exam by:: Gilmer Mor RN   Fetal Monitoring: Baseline: 140  Variability: mod Accelerations: present Decelerations: negative Contractions: q 4    A: SIUP at [redacted]w[redacted]d  False labor  P: Per [facility] on call CNM, patient to be discharged home.    [place], [place] 10/24/2018 10:52 PM

## 2018-10-24 NOTE — MAU Note (Signed)
Pt reports she was sitting on her bed and felt a trickle of fluid at 1930. Reports her panty liner feels wet. Pt occasional braxton hicks. Pt denies vaginal bleeding. Reports good fetal movement. Cervix was 3/60 on Monday.

## 2018-10-30 ENCOUNTER — Other Ambulatory Visit: Payer: Self-pay | Admitting: Obstetrics and Gynecology

## 2018-10-31 ENCOUNTER — Other Ambulatory Visit: Payer: Self-pay | Admitting: Advanced Practice Midwife

## 2018-11-02 ENCOUNTER — Telehealth (HOSPITAL_COMMUNITY): Payer: Self-pay | Admitting: *Deleted

## 2018-11-03 ENCOUNTER — Encounter (HOSPITAL_COMMUNITY): Payer: Self-pay | Admitting: Obstetrics

## 2018-11-03 ENCOUNTER — Inpatient Hospital Stay (HOSPITAL_COMMUNITY)
Admission: AD | Admit: 2018-11-03 | Discharge: 2018-11-04 | DRG: 807 | Disposition: A | Discharge status: Home / Self Care | Payer: BLUE CROSS/BLUE SHIELD | Attending: Obstetrics and Gynecology | Admitting: Obstetrics and Gynecology

## 2018-11-03 ENCOUNTER — Inpatient Hospital Stay (HOSPITAL_COMMUNITY): Payer: BLUE CROSS/BLUE SHIELD

## 2018-11-03 DIAGNOSIS — O3413 Maternal care for benign tumor of corpus uteri, third trimester: Secondary | ICD-10-CM | POA: Diagnosis present

## 2018-11-03 DIAGNOSIS — O26893 Other specified pregnancy related conditions, third trimester: Secondary | ICD-10-CM | POA: Diagnosis present

## 2018-11-03 DIAGNOSIS — D259 Leiomyoma of uterus, unspecified: Secondary | ICD-10-CM | POA: Diagnosis present

## 2018-11-03 DIAGNOSIS — O9902 Anemia complicating childbirth: Secondary | ICD-10-CM | POA: Diagnosis present

## 2018-11-03 DIAGNOSIS — D573 Sickle-cell trait: Secondary | ICD-10-CM | POA: Diagnosis present

## 2018-11-03 DIAGNOSIS — Z3A40 40 weeks gestation of pregnancy: Secondary | ICD-10-CM | POA: Diagnosis not present

## 2018-11-03 DIAGNOSIS — R32 Unspecified urinary incontinence: Secondary | ICD-10-CM | POA: Diagnosis present

## 2018-11-03 DIAGNOSIS — Z3490 Encounter for supervision of normal pregnancy, unspecified, unspecified trimester: Secondary | ICD-10-CM | POA: Diagnosis present

## 2018-11-03 LAB — CBC
HCT: 37.6 % (ref 36.0–46.0)
Hemoglobin: 12 g/dL (ref 12.0–15.0)
MCH: 26.3 pg (ref 26.0–34.0)
MCHC: 31.9 g/dL (ref 30.0–36.0)
MCV: 82.5 fL (ref 80.0–100.0)
PLATELETS: 224 10*3/uL (ref 150–400)
RBC: 4.56 MIL/uL (ref 3.87–5.11)
RDW: 13.4 % (ref 11.5–15.5)
WBC: 8.8 10*3/uL (ref 4.0–10.5)
nRBC: 0 % (ref 0.0–0.2)

## 2018-11-03 LAB — TYPE AND SCREEN
ABO/RH(D): B POS
Antibody Screen: NEGATIVE

## 2018-11-03 LAB — RPR: RPR Ser Ql: NONREACTIVE

## 2018-11-03 MED ORDER — OXYTOCIN 40 UNITS IN NORMAL SALINE INFUSION - SIMPLE MED
1.0000 m[IU]/min | INTRAVENOUS | Status: DC
  Administered 2018-11-03: 2 m[IU]/min via INTRAVENOUS

## 2018-11-03 MED ORDER — TERBUTALINE SULFATE 1 MG/ML IJ SOLN: 0.2500 mg | Freq: Once | INTRAMUSCULAR | Status: DC | PRN

## 2018-11-03 MED ORDER — PRENATAL MULTIVITAMIN CH
1.0000 | ORAL_TABLET | Freq: Every day | ORAL | Status: DC
  Administered 2018-11-04: 1 via ORAL
  Filled 2018-11-03: qty 1

## 2018-11-03 MED ORDER — BENZOCAINE-MENTHOL 20-0.5 % EX AERO
1.0000 "application " | INHALATION_SPRAY | CUTANEOUS | Status: DC | PRN
  Administered 2018-11-04: 1 via TOPICAL
  Filled 2018-11-03: qty 56

## 2018-11-03 MED ORDER — ONDANSETRON HCL 4 MG/2ML IJ SOLN: 4.0000 mg | Freq: Four times a day (QID) | INTRAMUSCULAR | Status: DC | PRN

## 2018-11-03 MED ORDER — IBUPROFEN 800 MG PO TABS: 800.0000 mg | ORAL_TABLET | ORAL | Status: DC

## 2018-11-03 MED ORDER — OXYTOCIN 40 UNITS IN NORMAL SALINE INFUSION - SIMPLE MED
2.5000 [IU]/h | INTRAVENOUS | Status: DC
  Filled 2018-11-03: qty 1000

## 2018-11-03 MED ORDER — LACTATED RINGERS IV SOLN
500.0000 mL | INTRAVENOUS | Status: DC | PRN
  Administered 2018-11-03: 1000 mL via INTRAVENOUS

## 2018-11-03 MED ORDER — ONDANSETRON HCL 4 MG PO TABS: 4.0000 mg | ORAL_TABLET | ORAL | Status: DC | PRN

## 2018-11-03 MED ORDER — IBUPROFEN 600 MG PO TABS
600.0000 mg | ORAL_TABLET | Freq: Four times a day (QID) | ORAL | Status: DC
  Administered 2018-11-03 – 2018-11-04 (×4): 600 mg via ORAL
  Filled 2018-11-03 (×4): qty 1

## 2018-11-03 MED ORDER — LIDOCAINE HCL (PF) 1 % IJ SOLN: 30.0000 mL | INTRAMUSCULAR | Status: DC | PRN

## 2018-11-03 MED ORDER — LACTATED RINGERS IV SOLN: INTRAVENOUS | Status: DC

## 2018-11-03 MED ORDER — OXYCODONE HCL 5 MG PO TABS: 5.0000 mg | ORAL_TABLET | ORAL | Status: DC | PRN

## 2018-11-03 MED ORDER — ACETAMINOPHEN 325 MG PO TABS
650.0000 mg | ORAL_TABLET | ORAL | Status: DC | PRN
  Administered 2018-11-03: 650 mg via ORAL
  Filled 2018-11-03: qty 2

## 2018-11-03 MED ORDER — SOD CITRATE-CITRIC ACID 500-334 MG/5ML PO SOLN: 30.0000 mL | ORAL | Status: DC | PRN

## 2018-11-03 MED ORDER — SIMETHICONE 80 MG PO CHEW: 80.0000 mg | CHEWABLE_TABLET | ORAL | Status: DC | PRN

## 2018-11-03 MED ORDER — COCONUT OIL OIL: 1.0000 "application " | TOPICAL_OIL | Status: DC | PRN

## 2018-11-03 MED ORDER — ACETAMINOPHEN 325 MG PO TABS
650.0000 mg | ORAL_TABLET | ORAL | Status: DC | PRN
  Administered 2018-11-04: 650 mg via ORAL
  Filled 2018-11-03: qty 2

## 2018-11-03 MED ORDER — DIPHENHYDRAMINE HCL 25 MG PO CAPS: 25.0000 mg | ORAL_CAPSULE | Freq: Four times a day (QID) | ORAL | Status: DC | PRN

## 2018-11-03 MED ORDER — DOCUSATE SODIUM 100 MG PO CAPS
100.0000 mg | ORAL_CAPSULE | Freq: Two times a day (BID) | ORAL | Status: DC
  Administered 2018-11-03 – 2018-11-04 (×2): 100 mg via ORAL
  Filled 2018-11-03 (×2): qty 1

## 2018-11-03 MED ORDER — ONDANSETRON HCL 4 MG/2ML IJ SOLN: 4.0000 mg | INTRAMUSCULAR | Status: DC | PRN

## 2018-11-03 MED ORDER — OXYTOCIN BOLUS FROM INFUSION
500.0000 mL | Freq: Once | INTRAVENOUS | Status: AC
  Administered 2018-11-03: 500 mL via INTRAVENOUS

## 2018-11-03 MED ORDER — WITCH HAZEL-GLYCERIN EX PADS: 1.0000 "application " | MEDICATED_PAD | CUTANEOUS | Status: DC | PRN

## 2018-11-03 MED ORDER — DIBUCAINE 1 % RE OINT
1.0000 "application " | TOPICAL_OINTMENT | RECTAL | Status: DC | PRN
  Administered 2018-11-04: 1 via RECTAL
  Filled 2018-11-03: qty 28

## 2018-11-03 MED ORDER — FLEET ENEMA 7-19 GM/118ML RE ENEM: 1.0000 | ENEMA | Freq: Every day | RECTAL | Status: DC | PRN

## 2018-11-03 MED ORDER — BISACODYL 10 MG RE SUPP: 10.0000 mg | Freq: Every day | RECTAL | Status: DC | PRN

## 2018-11-03 MED ORDER — TETANUS-DIPHTH-ACELL PERTUSSIS 5-2.5-18.5 LF-MCG/0.5 IM SUSP: 0.5000 mL | Freq: Once | INTRAMUSCULAR | Status: DC

## 2018-11-03 NOTE — Progress Notes (Addendum)
S: Doing well, pain minimal, using position changes while Pitocin infusing.  O: Vitals:   11/03/18 0908 11/03/18 0947 11/03/18 1048 11/03/18 1151  BP: 106/63 110/67 119/65 105/79  Pulse: 92 92 85 85  Resp:   16 18  Temp:    98.5 F (36.9 C)  TempSrc:    Oral  Weight:      Height:         FHT:  FHR: 125 bpm, variability: moderate,  accelerations:  Present,  decelerations:  Absent UC:   regular, every 2-3 minutes SVE:   Dilation: 5 Effacement (%): 70 Station: -1 Exam by:: Findley Blankenbaker cnm AROM, clear AF, small amount  A / P: Induction of labor due to term with favorable cervix,  progressing well on pitocin  Fetal Wellbeing:  Category I Pain Control:  Labor support without medications and will plan for hydrotherapy if FHT maintained Category 1 after AROM  Anticipated MOD:  NSVD  Juliene Pina, CNM, MSN 11/03/2018, 12:38 PM

## 2018-11-03 NOTE — Lactation Note (Signed)
This note was copied from a baby's chart. Lactation Consultation Note Experienced BF mom states BF going well.  Mom BF her 32 yr old for 2 yrs w/o difficulty, and her 100 month old for 15 months until she became pregnant.  Mom has everted nipples. Mom latched baby d/t baby crying.  Mom latching baby in cradle position, needing body alignment adjusted. Discussed feeding position options for newborns. Baby clamping, biting. Demonstrated chin tug and lip flange.  Heard audible swallows.  Newborn behavior reviewed, encouraged STS, I&O. Encouraged mom to call for questions or assistance. Mom feels things are going well at this time. Lactation brochure given.  Patient Name: Kim Alvarez Today's Date: 11/03/2018 Reason for consult: Initial assessment   Maternal Data Has patient been taught Hand Expression?: Yes Does the patient have breastfeeding experience prior to this delivery?: Yes  Feeding Feeding Type: Breast Fed  LATCH Score Latch: Grasps breast easily, tongue down, lips flanged, rhythmical sucking.  Audible Swallowing: Spontaneous and intermittent  Type of Nipple: Everted at rest and after stimulation  Comfort (Breast/Nipple): Soft / non-tender  Hold (Positioning): Assistance needed to correctly position infant at breast and maintain latch.  LATCH Score: 9  Interventions Interventions: Breast feeding basics reviewed;Adjust position;Assisted with latch;Support pillows;Skin to skin;Position options;Breast massage;Breast compression  Lactation Tools Discussed/Used WIC Program: No   Consult Status Consult Status: Follow-up Date: 11/04/18 Follow-up type: In-patient    Theodoro Kalata 11/03/2018, 11:23 PM

## 2018-11-03 NOTE — H&P (Addendum)
OB ADMISSION/ HISTORY & PHYSICAL:  Admission Date: 11/03/2018  7:29 AM  Admit Diagnosis: pregnancy    Kim Alvarez is a 33 y.o. female presenting for elective IOL, favorable cvx, hx rapid birth, planning repeat waterbirth.  Prenatal History: [id number]   EDC : 10/30/2018, by Other B[facility]  Prenatal care at [facility] since 1st T, Dr. Garwin Brothers primary.   Prenatal course complicated by: 1. Hx PTB, on 17OHP to 36 wks 2. Fibroids 3. SS trait  Prenatal Labs: ABO, Rh: B (08/19 0000)  Antibody: Negative (08/19 0000) Rubella: Immune (08/19 0000)  RPR: Nonreactive (08/19 0000)  HBsAg: Negative (08/19 0000)  HIV: Non-reactive (08/19 0000)  GBS: Negative (02/19 0000)  1 hr Glucola : normal Genetic Screening: declined Ultrasound: normal anatomy  Medical / Surgical History :  Past medical history:  Past Medical History:  Diagnosis Date  . Abnormal Pap smear 2007  . Allergy   . Chlamydia 2013   treated  . Family history of sickle cell anemia (Hgb SS) in mother   . FHx: cancer   . H/O varicella   . IUD (intrauterine device) in place 08/2010  . Sickle cell trait ([place])   . Yeast infection      Past surgical history:  Past Surgical History:  Procedure Laterality Date  . NO PAST SURGERIES       Family History:  Family History  Problem Relation Age of Onset  . Sickle cell anemia Mother        alive  . Sickle cell anemia Maternal Aunt        alive  . Breast cancer Paternal Aunt   . Sickle cell anemia Maternal Uncle        alive  . Sickle cell anemia Maternal Aunt        died  . Sickle cell anemia Maternal Uncle        died     Social History:  reports that she has never smoked. She has never used smokeless tobacco. She reports current alcohol use. She reports that she does not use drugs.   Allergies: Patient has no known allergies.   Current Medications at time of admission:  Medications Prior to Admission  Medication Sig Dispense Refill Last Dose   . Prenatal Vit-Fe Fumarate-FA (PRENATAL MULTIVITAMIN) TABS tablet Take 1 tablet by mouth every evening.   02/22/2017 at Unknown time     Review of Systems: ROS  Physical Exam: Vital signs and nursing notes reviewed.  ED Triage Vitals  Enc Vitals Group     BP 11/03/18 0732 108/67     Pulse Rate 11/03/18 0732 (!) 110     Resp 11/03/18 0732 18     Temp 11/03/18 0732 98.5 F (36.9 C)     Temp Source 11/03/18 0732 Oral     SpO2 --      Weight 11/03/18 0737 180 lb (81.6 kg)     Height 11/03/18 0737 5\' 4"  (1.626 m)     Head Circumference --      Peak Flow --      Pain Score 11/03/18 0737 0     Pain Loc --      Pain Edu? --      Excl. in [place]? --      General: AAO x 3, NAD, coping well Heart: RRR Lungs:CTAB Abdomen: Gravid, NT, Leopold's vertex, EFW 7.5 - 8 lbs Extremities: no edema Genitalia / VE: Dilation: 4.5 Effacement (%): 70 Cervical Position: Middle Station: -  1 Presentation: Vertex Exam by:: D. , CNM   FHR: 130 BPM, mod variability, + accels, prolonged spontaneous decel x 1, resolved w/ repositioning and fluids  TOCO: Ctx irregular, mild  Labs:   Pending T&S, CBC, RPR    Assessment:  33 y.o. [id number] at [redacted]w[redacted]d, desires waterbirth  1. Latent early labor 2. FHR category 1 and 2, random prolonged decel x 1 on admit (? Positional), otherwise category 1 3. GBS neg 4. Desires waterbirth, unmedicated 5. Breastfeeding 6. Placenta disposal per patient request  Plan:  1. Admit to BS 2. Routine L&D orders 3. Hydrotherapy if FHT remains category 1 throughout initiation of active labor, will start Pitocin augmentation then AROM  4. Anticipate NSVB   Dr Garwin Brothers notified of admission / plan of care   [place], MSN 11/03/2018, 8:28 AM

## 2018-11-04 LAB — CBC
HCT: 36.6 % (ref 36.0–46.0)
HEMOGLOBIN: 12.2 g/dL (ref 12.0–15.0)
MCH: 27.5 pg (ref 26.0–34.0)
MCHC: 33.3 g/dL (ref 30.0–36.0)
MCV: 82.6 fL (ref 80.0–100.0)
Platelets: 232 10*3/uL (ref 150–400)
RBC: 4.43 MIL/uL (ref 3.87–5.11)
RDW: 13.5 % (ref 11.5–15.5)
WBC: 13.4 10*3/uL — ABNORMAL HIGH (ref 4.0–10.5)
nRBC: 0 % (ref 0.0–0.2)

## 2018-11-04 LAB — ABO/RH: ABO/RH(D): B POS

## 2018-11-04 MED ORDER — IBUPROFEN 600 MG PO TABS: 600.0000 mg | ORAL_TABLET | Freq: Four times a day (QID) | ORAL | 0 refills | Status: DC

## 2018-11-04 NOTE — Discharge Summary (Signed)
Obstetric Discharge Summary   Patient Name: Kim Alvarez DOB: 1986/02/26 MRN: [id number]  Date of Admission: 11/03/2018 Date of Discharge: 11/04/2018 Date of Delivery: 11/02/2018 Gestational Age at Delivery: [redacted]w[redacted]d  Primary OB: [facility] - Dr. Garwin Brothers; transferred to midwives at 51 weeks for water birth  Antepartum complications:  1. Hx PTB, on 17OHP to 36 wks 2. Fibroids 3. SS trait Prenatal Labs:  ABO, Rh: B Positive (08/19 0000)  Antibody: Negative (08/19 0000) Rubella: Immune (08/19 0000)  RPR: Nonreactive (08/19 0000)  HBsAg: Negative (08/19 0000)  HIV: Non-reactive (08/19 0000)  GBS: Negative (02/19 0000)  1 hr Glucola : normal Genetic Screening: declined Ultrasound: normal anatomy Admitting Diagnosis: Elective IOL for postdates and favorable cervix at 40+4 weeks   Secondary Diagnoses: Patient Active Problem List   Diagnosis Date Noted  . Encounter for induction of labor 11/03/2018  . Postpartum care following water birth vaginal delivery 3/24 02/23/2017  . SVD (spontaneous vaginal delivery) : waterbirth  02/23/2017  . Second-degree perineal laceration, with delivery 02/23/2017  . Sickle cell trait ([place])   . Chlamydia   . Goiter, nontoxic, multinodular   . Abnormal Pap smear   . Yeast infection   . Fibroids     Augmentation: Pitocin and AROM Complications: None  Date of Delivery: 11/03/2018 Delivered By: D. Eddie Dibbles, CNM  Delivery Type: spontaneous vaginal delivery/water birth Anesthesia: hydrotherapy Placenta: sponatneous Laceration: none Episiotomy: none  Newborn Data: Live born female  Birth Weight: 7 lb 12.2 oz (3520 g) APGAR: 8, 9  Newborn Delivery   Birth date/time:  11/03/2018 15:28:00 Delivery type:  Vaginal, Spontaneous        Hospital/Postpartum Course  (Vaginal Delivery): Pt. Admitted for elective induction of labor and planning water birth.  She progressed on Pitocin and AROM, and Pitocin was stopped so patient could get into the  birthing tub.  See notes and delivery summary for details. Patient had an uncomplicated postpartum course.  By time of discharge on PPD#1, her pain was controlled on oral pain medications; she had appropriate lochia and was ambulating, voiding without difficulty and tolerating regular diet.  She did report some urinary incontinence after voids, but postvoid residual was normal.  I encouraged her to void every 2-3 hours and practice Kegel exercises.  She was deemed stable for discharge to home.    Labs: CBC Latest Ref Rng & Units 11/04/2018 11/03/2018 02/24/2017  WBC 4.0 - 10.5 K/uL 13.4(H) 8.8 16.3(H)  Hemoglobin 12.0 - 15.0 g/dL 12.2 12.0 10.3(L)  Hematocrit 36.0 - 46.0 % 36.6 37.6 30.2(L)  Platelets 150 - 400 K/uL 232 224 215   B POS  Physical exam:  BP 120/70   Pulse 93   Temp 98.5 F (36.9 C) (Oral)   Resp 18   Ht 5\' 4"  (1.626 m)   Wt 81.6 kg   SpO2 97%   Breastfeeding Unknown   BMI 30.90 kg/m  General: alert and no distress Pulm: normal respiratory effort Lochia: appropriate Abdomen: soft, NT Uterine Fundus: firm, below umbilicus Perineum: healing well, no significant erythema, no significant edema Extremities: No evidence of DVT seen on physical exam. No lower extremity edema.   Disposition: stable, discharge to home Baby Feeding: breast milk Baby Disposition: home with mom  Contraception: Mirena IUD or Slynd OCPs  Rh Immune globulin given: N/A Rubella vaccine given: N/A Tdap vaccine given in AP or PP setting: UTD Flu vaccine given in AP or PP setting: UTD   Plan:  [name] was  discharged to home in good condition. Follow-up appointment at [facility] in 6 weeks.  Discharge Instructions: Per After Visit Summary. Refer to After Visit Summary and [facility] discharge booklet  Activity: Advance as tolerated. Pelvic rest for 6 weeks.   Diet: Regular, Heart Healthy Discharge Medications: Allergies as of 11/04/2018   No Known Allergies     Medication  List    TAKE these medications   Emergen-C Immune Plus Pack Take 1 Package by mouth daily as needed (immune boost).   ibuprofen 600 MG tablet Commonly known as:  ADVIL,MOTRIN Take 1 tablet (600 mg total) by mouth every 6 (six) hours.   prenatal multivitamin Tabs tablet Take 1 tablet by mouth every evening.      Outpatient follow up:  Follow-up Information    Servando Salina, MD. Schedule an appointment as soon as possible for a visit in 6 week(s).   Specialty:  Obstetrics and Gynecology Why:  Postpartum visit  Contact information: [address] [place] [address] [phone]           Signed:  Lars Pinks, MSN, CNM [facility]

## 2018-11-04 NOTE — Progress Notes (Addendum)
PPD #1, SVD/water birth, intact perineum, baby girl "September"  S:  Reports feeling well, but sore  Desires early discharge home at 24 hrs              Tolerating po/ No nausea or vomiting / Denies dizziness or SOB             Bleeding is light             Pain controlled with Tylenol and Motrin              Up ad lib / ambulatory / voiding QS - but states she sometimes she leaks urine when standing up after going to the bathroom   Newborn breast feeding - going well per mom   O:               VS: BP 120/70   Pulse 93   Temp 98.5 F (36.9 C) (Oral)   Resp 18   Ht 5\' 4"  (1.626 m)   Wt 81.6 kg   SpO2 97%   Breastfeeding Unknown   BMI 30.90 kg/m    LABS:              Recent Labs    11/03/18 0743 11/04/18 0632  WBC 8.8 13.4*  HGB 12.0 12.2  PLT 224 232               Blood type: --/--/B POS, B POS (03/24 0804)  Rubella: Immune (08/19 0000)                     I&O: Intake/Output      03/24 0701 - 03/25 0700 03/25 0701 - 03/26 0700   I.V. (mL/kg) 0 (0)    Total Intake(mL/kg) 0 (0)    Blood 400    Total Output 400    Net -400                       Physical Exam:             Alert and oriented X3  Lungs: Clear and unlabored  Heart: regular rate and rhythm / no murmurs  Abdomen: soft, non-tender, non-distended              Fundus: firm, non-tender, U-E  Perineum: intact, no edema, erythema, or ecchymosis   Lochia: small, no clots   Extremities: no edema, no calf pain or tenderness    A: PPD # 1, SVD/water birth   Urinary incontinence   Doing well - stable status  P: Routine post partum orders  Post void bladder scan x 1 - notify with results; encouraged Kegel exercises and voiding every 2-3 hours to prevent overdistention   If normal, okay to discharge home today  [facility] discharge book, instructions, and warning s/s reviewed   F/u with Dr. Garwin Brothers in 6 weeks; desires Mirena IUD or Slynd OCPs  Lars Pinks, MSN, CNM [facility]    Addendum: Post-void bladder scan: 38mL per RN;  I encouraged her to void every 2-3 hours and practice Kegel exercises.  F/u for worsening s/s.

## 2021-02-02 ENCOUNTER — Encounter: Payer: Self-pay | Admitting: Physician Assistant

## 2021-02-20 ENCOUNTER — Other Ambulatory Visit: Payer: Medicaid Other

## 2021-03-01 ENCOUNTER — Other Ambulatory Visit: Payer: Self-pay | Admitting: *Deleted

## 2021-03-02 ENCOUNTER — Ambulatory Visit: Payer: Medicaid Other | Admitting: Physician Assistant

## 2021-03-02 ENCOUNTER — Encounter: Payer: Self-pay | Admitting: Physician Assistant

## 2021-03-02 VITALS — BP 98/62 | HR 72 | Ht 64.0 in | Wt 163.6 lb

## 2021-03-02 DIAGNOSIS — R198 Other specified symptoms and signs involving the digestive system and abdomen: Secondary | ICD-10-CM | POA: Diagnosis not present

## 2021-03-02 DIAGNOSIS — R197 Diarrhea, unspecified: Secondary | ICD-10-CM

## 2021-03-02 DIAGNOSIS — R1084 Generalized abdominal pain: Secondary | ICD-10-CM | POA: Diagnosis not present

## 2021-03-02 DIAGNOSIS — R194 Change in bowel habit: Secondary | ICD-10-CM

## 2021-03-02 DIAGNOSIS — R0989 Other specified symptoms and signs involving the circulatory and respiratory systems: Secondary | ICD-10-CM

## 2021-03-02 DIAGNOSIS — K921 Melena: Secondary | ICD-10-CM

## 2021-03-02 MED ORDER — DICYCLOMINE HCL 10 MG PO CAPS: 10.0000 mg | ORAL_CAPSULE | Freq: Two times a day (BID) | ORAL | 3 refills | Status: DC

## 2021-03-02 MED ORDER — NA SULFATE-K SULFATE-MG SULF 17.5-3.13-1.6 GM/177ML PO SOLN: 1.0000 | Freq: Once | ORAL | 0 refills | Status: AC

## 2021-03-02 NOTE — Progress Notes (Signed)
Chief Complaint: Bloody diarrhea and trouble swallowing  HPI:    Kim Alvarez is a 35 year old African-American female with a past medical history as listed below including sickle cell trait, who was referred to me by Terald Sleeper, PA-C for a complaint of bloody diarrhea and trouble swallowing.      01/23/2021 patient seen by PCP for difficulty swallowing as well as bloody diarrhea.  Patient was given Lomotil 1-2 times a day to help.  CMP and CBC normal.    Today, the patient presents to clinic and tells me that over the past "few months", she has had a change in bowel habits noting that prior to this she had a solid stool about 3 times a day and now it is mostly watery every time that she sits down to even urinate she will pass some stool.  She has noticed a red in the watery stool and very occasionally when she has a more formed stool she is seeing bright red blood.  Tells me she has had increased gas and an increase in generalized abdominal soreness.  She even had an episode of fecal urgency and had a "accident".  Does me she has been trying to eat "healthier", but nothing else has changed.    Also discusses that occasionally she will wake up with a "neck soreness and feel tightness", and oftentimes during this she will have some trouble swallowing her saliva and feel like there is something "in my throat".  Initially thought this was related to her thyroid as she has been told this is "hyperactive" in the past, but now not sure so sure what is going on.  Denies heartburn or reflux.    Vague history of possible colon cancer in her father.  Unsure of the rest of her family's history.    Social history positive for being the mother of 4 children, her youngest is 2.    Denies fever, chills, weight loss, family history of IBD or symptoms that awaken her from sleep.  Past Medical History:  Diagnosis Date   Abnormal Pap smear 2007   Allergy    Chlamydia 2013   treated   Family history of sickle cell  anemia (Hgb SS) in mother    FHx: cancer    H/O varicella    IUD (intrauterine device) in place 08/2010   Sickle cell trait ([place])    Yeast infection     Past Surgical History:  Procedure Laterality Date   NO PAST SURGERIES      Current Outpatient Medications  Medication Sig Dispense Refill   ibuprofen (ADVIL,MOTRIN) 600 MG tablet Take 1 tablet (600 mg total) by mouth every 6 (six) hours. 30 tablet 0   Multiple Vitamins-Minerals (EMERGEN-C IMMUNE PLUS) PACK Take 1 Package by mouth daily as needed (immune boost).     No current facility-administered medications for this visit.    Allergies as of 03/02/2021   (No Known Allergies)    Family History  Problem Relation Age of Onset   Sickle cell anemia Mother        alive   Sickle cell anemia Maternal Aunt        alive   Sickle cell anemia Maternal Aunt        died   Sickle cell anemia Maternal Uncle        alive   Sickle cell anemia Maternal Uncle        died   Breast cancer Paternal Aunt    Colon  cancer Neg Hx    Esophageal cancer Neg Hx    Pancreatic cancer Neg Hx    Stomach cancer Neg Hx    Liver disease Neg Hx     Social History   Socioeconomic History   Marital status: Married    Spouse name: Not on file   Number of children: Not on file   Years of education: Not on file   Highest education level: Not on file  Occupational History   Not on file  Tobacco Use   Smoking status: Never    Passive exposure: Never   Smokeless tobacco: Never  Vaping Use   Vaping Use: Not on file  Substance and Sexual Activity   Alcohol use: Yes    Alcohol/week: 0.0 standard drinks    Comment: socially    Drug use: No   Sexual activity: Yes    Partners: Male    Birth control/protection: None    Comment: last IC- this morning   Other Topics Concern   Not on file  Social History Narrative   Not on file   Social Determinants of Health   Financial Resource Strain: Not on file  Food Insecurity: Not on file   Transportation Needs: Not on file  Physical Activity: Not on file  Stress: Not on file  Social Connections: Not on file  Intimate Partner Violence: Not on file    Review of Systems:    Constitutional: No weight loss, fever or chills Skin: No rash  Cardiovascular: No chest pain Respiratory: No SOB Gastrointestinal: See HPI and otherwise negative Genitourinary: No dysuria  Neurological: No headache, dizziness or syncope Musculoskeletal: No new muscle or joint pain Hematologic: No  bruising Psychiatric: No history of depression or anxiety   Physical Exam:  Vital signs: BP 98/62   Pulse 72   Ht '5\' 4"'$  (1.626 m)   Wt 163 lb 9.6 oz (74.2 kg)   LMP  (LMP Unknown)   Breastfeeding No   BMI 28.08 kg/m   Constitutional:   Pleasant AA female appears to be in NAD, Well developed, Well nourished, alert and cooperative Head:  Normocephalic and atraumatic. Eyes:   PEERL, EOMI. No icterus. Conjunctiva pink. Ears:  Normal auditory acuity. Neck:  Supple Throat: Oral cavity and pharynx without inflammation, swelling or lesion.  Respiratory: Respirations even and unlabored. Lungs clear to auscultation bilaterally.   No wheezes, crackles, or rhonchi.  Cardiovascular: Normal S1, S2. No MRG. Regular rate and rhythm. No peripheral edema, cyanosis or pallor.  Gastrointestinal:  Soft, nondistended, nontender. No rebound or guarding. Normal bowel sounds. No appreciable masses or hepatomegaly. Rectal: External: No abnormality, internal: No mass; anoscopy: No abnormality Msk:  Symmetrical without gross deformities. Without edema, no deformity or joint abnormality.  Neurologic:  Alert and  oriented x4;  grossly normal neurologically.  Skin:   Dry and intact without significant lesions or rashes. Psychiatric:  Demonstrates good judgement and reason without abnormal affect or behaviors.  See HPI for recent labs.  Assessment: 1.  Hematochezia: With diarrheal stools and abdominal pain; need to rule out  IBD 2.  Change in bowel habits: Towards diarrhea as below 3.  Diarrhea: Over the past few months per patient, mostly watery stools; consider infectious cause versus IBD versus inflammatory versus other 4.  Abdominal pain: Generalized abdominal pain worse over the past couple months with change in bowel habits and hematochezia; consider IBS versus IBD versus infectious diarrhea/colitis 5.  Globus sensation: Feels like something is "in her throat", upon  awakening some mornings; consider reflux versus postnasal drip versus esophagitis versus esophageal stricture  Plan: 1.  Discussed with patient that first step is to do a GI pathogen panel to ensure we are not dealing with an infectious cause for diarrhea.  Discussed in detail how to collect stool specimen. 2.  Discussed with patient that since I cannot see a reason for her bleeding on exam today we need her to proceed with colonoscopy.  We will also do an EGD with possible dilation at the same time for globus sensation which she has been experiencing.  Did provide the patient a detailed list of risks for the procedures and she agrees to proceed.  She was scheduled with Dr. Ardis Hughs as he had availability and she will continue to follow with him as her primary GI physician.  Procedures were scheduled 4-6 weeks out to allow time for GI pathogen panel. 3.  Discussed with patient that if stool studies returned positive then we will treat her first and may not need to do colonoscopy. 4.  Prescribed Dicyclomine 10 mg twice daily for generalized abdominal pain and diarrhea.  Prescribed #60 with 3 refills. 5.  Patient to follow in clinic per recommendations after stool studies above/endoscopic procedures.  Ellouise Newer, PA-C [facility] 03/02/2021, 9:48 AM  Cc: Terald Sleeper, PA-C

## 2021-03-02 NOTE — Patient Instructions (Signed)
We have sent the following medications to your pharmacy for you to pick up at your convenience: Dicyclomine 10 mg twice daily for abdominal pain.   You have been scheduled for a colonoscopy. Please follow written instructions given to you at your visit today.  Please pick up your prep supplies at the pharmacy within the next 1-3 days. If you use inhalers (even only as needed), please bring them with you on the day of your procedure.  If you are age 34 or older, your body mass index should be between 23-30. Your Body mass index is 28.08 kg/m. If this is out of the aforementioned range listed, please consider follow up with your Primary Care Provider.  If you are age 106 or younger, your body mass index should be between 19-25. Your Body mass index is 28.08 kg/m. If this is out of the aformentioned range listed, please consider follow up with your Primary Care Provider.   __________________________________________________________  The [facility] providers would like to encourage you to use [facility] to communicate with providers for non-urgent requests or questions.  Due to long hold times on the telephone, sending your provider a message by [facility] may be a faster and more efficient way to get a response.  Please allow 48 business hours for a response.  Please remember that this is for non-urgent requests.

## 2021-03-02 NOTE — Progress Notes (Signed)
I agree with the above note, plan 

## 2021-04-06 ENCOUNTER — Other Ambulatory Visit: Payer: Self-pay

## 2021-04-06 ENCOUNTER — Other Ambulatory Visit: Payer: Medicaid Other | Admitting: Gastroenterology

## 2021-04-06 ENCOUNTER — Encounter: Payer: Self-pay | Admitting: Gastroenterology

## 2021-04-06 ENCOUNTER — Ambulatory Visit (AMBULATORY_SURGERY_CENTER): Payer: Medicaid Other | Admitting: Gastroenterology

## 2021-04-06 VITALS — BP 99/57 | HR 86 | Temp 98.0°F | Resp 18 | Ht 64.0 in | Wt 162.0 lb

## 2021-04-06 DIAGNOSIS — K921 Melena: Secondary | ICD-10-CM

## 2021-04-06 DIAGNOSIS — R197 Diarrhea, unspecified: Secondary | ICD-10-CM | POA: Diagnosis not present

## 2021-04-06 DIAGNOSIS — K5100 Ulcerative (chronic) pancolitis without complications: Secondary | ICD-10-CM

## 2021-04-06 DIAGNOSIS — K529 Noninfective gastroenteritis and colitis, unspecified: Secondary | ICD-10-CM

## 2021-04-06 DIAGNOSIS — K2970 Gastritis, unspecified, without bleeding: Secondary | ICD-10-CM

## 2021-04-06 DIAGNOSIS — K2951 Unspecified chronic gastritis with bleeding: Secondary | ICD-10-CM | POA: Diagnosis not present

## 2021-04-06 MED ORDER — ROWASA 4 G RE KIT: 4.0000 g | PACK | Freq: Every evening | RECTAL | 6 refills | Status: DC

## 2021-04-06 MED ORDER — SODIUM CHLORIDE 0.9 % IV SOLN: 500.0000 mL | INTRAVENOUS | Status: DC

## 2021-04-06 NOTE — Progress Notes (Signed)
Vs CW ° °

## 2021-04-06 NOTE — Op Note (Signed)
[place] Patient Name: Kim Alvarez Procedure Date: 04/06/2021 1:25 PM MRN: [id number] Endoscopist: Milus Banister , MD Age: 35 Referring MD:  Date of Birth: [birth year] Gender: Female Account #: [id number] Procedure:                Upper GI endoscopy Indications:              Globus sensation Medicines:                Monitored Anesthesia Care Procedure:                Pre-Anesthesia Assessment:                           - Prior to the procedure, a History and Physical                            was performed, and patient medications and                            allergies were reviewed. The patient's tolerance of                            previous anesthesia was also reviewed. The risks                            and benefits of the procedure and the sedation                            options and risks were discussed with the patient.                            All questions were answered, and informed consent                            was obtained. Prior Anticoagulants: The patient has                            taken no previous anticoagulant or antiplatelet                            agents. ASA Grade Assessment: II - A patient with                            mild systemic disease. After reviewing the risks                            and benefits, the patient was deemed in                            satisfactory condition to undergo the procedure.                           After obtaining informed consent, the endoscope was  passed under direct vision. Throughout the                            procedure, the patient's blood pressure, pulse, and                            oxygen saturations were monitored continuously. The                            GIF [id number] was introduced through the                            mouth, and advanced to the second part of duodenum.                            The upper GI endoscopy was accomplished  without                            difficulty. The patient tolerated the procedure                            well. Scope In: Scope Out: Findings:                 Mild inflammation characterized by erythema and                            friability was found in the gastric antrum.                            Biopsies were taken with a cold forceps for                            histology.                           The exam was otherwise without abnormality. Complications:            No immediate complications. Estimated blood loss:                            None. Estimated Blood Loss:     Estimated blood loss: none. Impression:               - Mild, non-specific gastritis. Biopsied to check                            for H. pylori.                           - The examination was otherwise normal. Recommendation:           - Patient has a contact number available for                            emergencies. The signs and symptoms of potential  delayed complications were discussed with the                            patient. Return to normal activities tomorrow.                            Written discharge instructions were provided to the                            patient.                           - Resume previous diet.                           - Continue present medications.                           - Await pathology results. Milus Banister, MD 04/06/2021 1:58:13 PM This report has been signed electronically.

## 2021-04-06 NOTE — Progress Notes (Signed)
HPI: This is a woman with bloody diarrhea, abd pain. globus   ROS: complete GI ROS as described in HPI, all other review negative.  Constitutional:  No unintentional weight loss   Past Medical History:  Diagnosis Date   Abnormal Pap smear 2007   Allergy    Chlamydia 2013   treated   Family history of sickle cell anemia (Hgb SS) in mother    FHx: cancer    H/O varicella    IUD (intrauterine device) in place 08/2010   Sickle cell trait ([place])    Yeast infection     Past Surgical History:  Procedure Laterality Date   NO PAST SURGERIES     WISDOM TOOTH EXTRACTION Bilateral     Current Outpatient Medications  Medication Sig Dispense Refill   Multiple Vitamins-Minerals (EMERGEN-C IMMUNE PLUS) PACK Take 1 Package by mouth daily as needed (immune boost).     Current Facility-Administered Medications  Medication Dose Route Frequency Provider Last Rate Last Admin   0.9 %  sodium chloride infusion  500 mL Intravenous Continuous Milus Banister, MD        Allergies as of 04/06/2021   (No Known Allergies)    Family History  Problem Relation Age of Onset   Sickle cell anemia Mother        alive   Colon cancer Father    Sickle cell anemia Maternal Aunt        alive   Sickle cell anemia Maternal Aunt        died   Sickle cell anemia Maternal Uncle        alive   Sickle cell anemia Maternal Uncle        died   Breast cancer Paternal Aunt    Esophageal cancer Neg Hx    Pancreatic cancer Neg Hx    Stomach cancer Neg Hx    Liver disease Neg Hx     Social History   Socioeconomic History   Marital status: Married    Spouse name: Not on file   Number of children: Not on file   Years of education: Not on file   Highest education level: Not on file  Occupational History   Not on file  Tobacco Use   Smoking status: Never    Passive exposure: Never   Smokeless tobacco: Never  Vaping Use   Vaping Use: Never used  Substance and Sexual Activity   Alcohol use: Yes     Alcohol/week: 0.0 standard drinks    Comment: socially    Drug use: No   Sexual activity: Yes    Partners: Male    Birth control/protection: None    Comment: last IC- this morning   Other Topics Concern   Not on file  Social History Narrative   Not on file   Social Determinants of Health   Financial Resource Strain: Not on file  Food Insecurity: Not on file  Transportation Needs: Not on file  Physical Activity: Not on file  Stress: Not on file  Social Connections: Not on file  Intimate Partner Violence: Not on file     Physical Exam: BP (!) 82/50   Pulse 84   Temp 98 F (36.7 C)   Ht '5\' 4"'$  (1.626 m)   Wt 162 lb (73.5 kg)   SpO2 95%   BMI 27.81 kg/m  Constitutional: generally well-appearing Psychiatric: alert and oriented x3 Lungs: CTA bilaterally Heart: no MCR  Assessment and plan: 35 y.o. female with bloody  diarrhea, abd pain, globus  For colonoscopy and EGD today  Care is appropriate for the ambulatory setting.  Owens Loffler, MD [facility] 04/06/2021, 1:23 PM

## 2021-04-06 NOTE — Op Note (Signed)
[place] Patient Name: Kim Alvarez Procedure Date: 04/06/2021 1:26 PM MRN: [id number] Endoscopist: Milus Banister , MD Age: 35 Referring MD:  Date of Birth: 10-05-1985 Gender: Female Account #: [id number] Procedure:                Colonoscopy Indications:              Interrmittently bloody diarrhea for several months Medicines:                Monitored Anesthesia Care Procedure:                Pre-Anesthesia Assessment:                           - Prior to the procedure, a History and Physical                            was performed, and patient medications and                            allergies were reviewed. The patient's tolerance of                            previous anesthesia was also reviewed. The risks                            and benefits of the procedure and the sedation                            options and risks were discussed with the patient.                            All questions were answered, and informed consent                            was obtained. Prior Anticoagulants: The patient has                            taken no previous anticoagulant or antiplatelet                            agents. ASA Grade Assessment: II - A patient with                            mild systemic disease. After reviewing the risks                            and benefits, the patient was deemed in                            satisfactory condition to undergo the procedure.                           After obtaining informed consent, the colonoscope  was passed under direct vision. Throughout the                            procedure, the patient's blood pressure, pulse, and                            oxygen saturations were monitored continuously. The                            Olympus [id number]) Colonoscope was                            introduced through the anus and advanced to the the                            terminal  ileum. The colonoscopy was performed                            without difficulty. The patient tolerated the                            procedure well. The quality of the bowel                            preparation was good. The terminal ileum, ileocecal                            valve, appendiceal orifice, and rectum were                            photographed. Scope In: 1:33:07 PM Scope Out: 1:46:43 PM Scope Withdrawal Time: 0 hours 7 minutes 49 seconds  Total Procedure Duration: 0 hours 13 minutes 36 seconds  Findings:                 The terminal ileum appeared normal.                           Mild to moderate inflammation from the anus to 17cm                            from the anus with a distinct transition to normal                            mucosa proximally. This inflamed segment was                            biopsied extensively.                           The exam was otherwise without abnormality on                            direct and retroflexion views. The normal appearing  right and left colon segments were biopsied. Complications:            No immediate complications. Estimated blood loss:                            None. Estimated Blood Loss:     Estimated blood loss: none. Impression:               - Mild to moderate distal colitis (proctitis).                           - Normal colon and affected segment biopsied. Recommendation:           - Await pathology results.                           - New start rowasa enema , 1 enema nightly, disp 30                            with 6 refills.                           - EGD now. Milus Banister, MD 04/06/2021 1:56:07 PM This report has been signed electronically.

## 2021-04-06 NOTE — Progress Notes (Signed)
Report to PACU, RN, vss, BBS= Clear.  

## 2021-04-06 NOTE — Patient Instructions (Signed)
YOU HAD AN ENDOSCOPIC PROCEDURE TODAY AT THE [facility]:   Refer to the procedure report that was given to you for any specific questions about what was found during the examination.  If the procedure report does not answer your questions, please call your gastroenterologist to clarify.  If you requested that your care partner not be given the details of your procedure findings, then the procedure report has been included in a sealed envelope for you to review at your convenience later. ? ?**Handout given on Gastritis** ? ?YOU SHOULD EXPECT: Some feelings of bloating in the abdomen. Passage of more gas than usual.  Walking can help get rid of the air that was put into your GI tract during the procedure and reduce the bloating. If you had a lower endoscopy (such as a colonoscopy or flexible sigmoidoscopy) you may notice spotting of blood in your stool or on the toilet paper. If you underwent a bowel prep for your procedure, you may not have a normal bowel movement for a few days. ? ?Please Note:  You might notice some irritation and congestion in your nose or some drainage.  This is from the oxygen used during your procedure.  There is no need for concern and it should clear up in a day or so. ? ?SYMPTOMS TO REPORT IMMEDIATELY: ? ?Following lower endoscopy (colonoscopy or flexible sigmoidoscopy): ? Excessive amounts of blood in the stool ? Significant tenderness or worsening of abdominal pains ? Swelling of the abdomen that is new, acute ? Fever of 100?F or higher ? ?Following upper endoscopy (EGD) ? Vomiting of blood or coffee ground material ? New chest pain or pain under the shoulder blades ? Painful or persistently difficult swallowing ? New shortness of breath ? Fever of 100?F or higher ? Black, tarry-looking stools ? ?For urgent or emergent issues, a gastroenterologist can be reached at any hour by calling [phone]. ?Do not use MyChart messaging for urgent concerns.  ? ? ?DIET:  We do  recommend a small meal at first, but then you may proceed to your regular diet.  Drink plenty of fluids but you should avoid alcoholic beverages for 24 hours. ? ?ACTIVITY:  You should plan to take it easy for the rest of today and you should NOT DRIVE or use heavy machinery until tomorrow (because of the sedation medicines used during the test).   ? ?FOLLOW UP: ?Our staff will call the number listed on your records 48-72 hours following your procedure to check on you and address any questions or concerns that you may have regarding the information given to you following your procedure. If we do not reach you, we will leave a message.  We will attempt to reach you two times.  During this call, we will ask if you have developed any symptoms of COVID 19. If you develop any symptoms (ie: fever, flu-like symptoms, shortness of breath, cough etc.) before then, please call [phone].  If you test positive for Covid 19 in the 2 weeks post procedure, please call and report this information to us.   ? ?If any biopsies were taken you will be contacted by phone or by letter within the next 1-3 weeks.  Please call us at [phone] if you have not heard about the biopsies in 3 weeks.  ? ? ?SIGNATURES/CONFIDENTIALITY: ?You and/or your care partner have signed paperwork which will be entered into your electronic medical record.  These signatures attest to the fact that that the   information above on your After Visit Summary has been reviewed and is understood.  Full responsibility of the confidentiality of this discharge information lies with you and/or your care-partner.  ?

## 2021-04-06 NOTE — Progress Notes (Signed)
Called to room to assist during endoscopic procedure.  Patient ID and intended procedure confirmed with present staff. Received instructions for my participation in the procedure from the performing physician.  

## 2021-04-09 ENCOUNTER — Other Ambulatory Visit: Payer: Self-pay

## 2021-04-09 MED ORDER — MESALAMINE 4 G RE ENEM: 4.0000 g | ENEMA | Freq: Every day | RECTAL | 0 refills | Status: DC

## 2021-04-10 ENCOUNTER — Telehealth: Payer: Self-pay

## 2021-04-10 ENCOUNTER — Telehealth: Payer: Self-pay | Admitting: *Deleted

## 2021-04-10 NOTE — Telephone Encounter (Signed)
Attempted to reach patient for post-procedure f/u call. No answer. Left message for her to please not hesitate to call us if she has any questions/concerns regarding her care. 

## 2021-04-10 NOTE — Telephone Encounter (Signed)
Follow up call made. 

## 2021-12-03 DIAGNOSIS — F4320 Adjustment disorder, unspecified: Secondary | ICD-10-CM | POA: Diagnosis not present

## 2022-01-01 DIAGNOSIS — F4320 Adjustment disorder, unspecified: Secondary | ICD-10-CM | POA: Diagnosis not present

## 2022-01-02 DIAGNOSIS — F4320 Adjustment disorder, unspecified: Secondary | ICD-10-CM | POA: Diagnosis not present

## 2022-01-08 DIAGNOSIS — F4320 Adjustment disorder, unspecified: Secondary | ICD-10-CM | POA: Diagnosis not present

## 2022-01-14 DIAGNOSIS — F4320 Adjustment disorder, unspecified: Secondary | ICD-10-CM | POA: Diagnosis not present

## 2022-01-21 DIAGNOSIS — F4320 Adjustment disorder, unspecified: Secondary | ICD-10-CM | POA: Diagnosis not present

## 2022-02-11 DIAGNOSIS — F4320 Adjustment disorder, unspecified: Secondary | ICD-10-CM | POA: Diagnosis not present

## 2022-04-11 ENCOUNTER — Emergency Department (HOSPITAL_COMMUNITY): Payer: BC Managed Care – PPO

## 2022-04-11 ENCOUNTER — Emergency Department (HOSPITAL_COMMUNITY)
Admission: EM | Admit: 2022-04-11 | Discharge: 2022-04-11 | Disposition: A | Discharge status: Home / Self Care | Payer: BC Managed Care – PPO | Attending: Emergency Medicine | Admitting: Emergency Medicine

## 2022-04-11 ENCOUNTER — Other Ambulatory Visit: Payer: Self-pay

## 2022-04-11 ENCOUNTER — Encounter (HOSPITAL_COMMUNITY): Payer: Self-pay | Admitting: Emergency Medicine

## 2022-04-11 DIAGNOSIS — R079 Chest pain, unspecified: Secondary | ICD-10-CM | POA: Diagnosis not present

## 2022-04-11 DIAGNOSIS — I2699 Other pulmonary embolism without acute cor pulmonale: Secondary | ICD-10-CM | POA: Insufficient documentation

## 2022-04-11 DIAGNOSIS — J9811 Atelectasis: Secondary | ICD-10-CM | POA: Diagnosis not present

## 2022-04-11 DIAGNOSIS — Z7901 Long term (current) use of anticoagulants: Secondary | ICD-10-CM | POA: Diagnosis not present

## 2022-04-11 LAB — BASIC METABOLIC PANEL
Anion gap: 9 (ref 5–15)
BUN: 11 mg/dL (ref 6–20)
CO2: 22 mmol/L (ref 22–32)
Calcium: 9.3 mg/dL (ref 8.9–10.3)
Chloride: 108 mmol/L (ref 98–111)
Creatinine, Ser: 0.83 mg/dL (ref 0.44–1.00)
GFR, Estimated: >60 mL/min (ref >60)
Glucose, Bld: 101 mg/dL — ABNORMAL HIGH (ref 70–99)
Potassium: 4.1 mmol/L (ref 3.5–5.1)
Sodium: 139 mmol/L (ref 135–145)

## 2022-04-11 LAB — TROPONIN I (HIGH SENSITIVITY)
Troponin I (High Sensitivity): <2 ng/L (ref <18)
Troponin I (High Sensitivity): <2 ng/L (ref <18)

## 2022-04-11 LAB — CBC
HCT: 35.1 % — ABNORMAL LOW (ref 36.0–46.0)
Hemoglobin: 11.6 g/dL — ABNORMAL LOW (ref 12.0–15.0)
MCH: 26.1 pg (ref 26.0–34.0)
MCHC: 33 g/dL (ref 30.0–36.0)
MCV: 78.9 fL — ABNORMAL LOW (ref 80.0–100.0)
Platelets: 304 10*3/uL (ref 150–400)
RBC: 4.45 MIL/uL (ref 3.87–5.11)
RDW: 12.9 % (ref 11.5–15.5)
WBC: 8.5 10*3/uL (ref 4.0–10.5)
nRBC: 0 % (ref 0.0–0.2)

## 2022-04-11 LAB — I-STAT BETA HCG BLOOD, ED (MC, WL, AP ONLY): I-stat hCG, quantitative: <5 m[IU]/mL (ref <5)

## 2022-04-11 LAB — D-DIMER, QUANTITATIVE: D-Dimer, Quant: 1.56 ug/mL-FEU — ABNORMAL HIGH (ref 0.00–0.50)

## 2022-04-11 MED ORDER — KETOROLAC TROMETHAMINE 15 MG/ML IJ SOLN
15.0000 mg | Freq: Once | INTRAMUSCULAR | Status: AC
  Administered 2022-04-11: 15 mg via INTRAVENOUS
  Filled 2022-04-11: qty 1

## 2022-04-11 MED ORDER — ONDANSETRON HCL 4 MG/2ML IJ SOLN
4.0000 mg | Freq: Once | INTRAMUSCULAR | Status: AC
  Administered 2022-04-11: 4 mg via INTRAVENOUS
  Filled 2022-04-11: qty 2

## 2022-04-11 MED ORDER — MORPHINE SULFATE (PF) 4 MG/ML IV SOLN
4.0000 mg | Freq: Once | INTRAVENOUS | Status: AC
  Administered 2022-04-11: 4 mg via INTRAVENOUS
  Filled 2022-04-11: qty 1

## 2022-04-11 MED ORDER — RIVAROXABAN 15 MG PO TABS
15.0000 mg | ORAL_TABLET | Freq: Once | ORAL | Status: AC
  Administered 2022-04-11: 15 mg via ORAL
  Filled 2022-04-11: qty 1

## 2022-04-11 MED ORDER — FENTANYL CITRATE PF 50 MCG/ML IJ SOSY
25.0000 ug | PREFILLED_SYRINGE | Freq: Once | INTRAMUSCULAR | Status: AC
  Administered 2022-04-11: 25 ug via INTRAVENOUS
  Filled 2022-04-11: qty 1

## 2022-04-11 MED ORDER — HYDROCODONE-ACETAMINOPHEN 5-325 MG PO TABS: 1.0000 | ORAL_TABLET | Freq: Four times a day (QID) | ORAL | 0 refills | Status: DC | PRN

## 2022-04-11 MED ORDER — RIVAROXABAN (XARELTO) EDUCATION KIT FOR DVT/PE PATIENTS
PACK | Freq: Once | Status: DC
  Filled 2022-04-11: qty 1

## 2022-04-11 MED ORDER — RIVAROXABAN (XARELTO) VTE STARTER PACK (15 & 20 MG): 15.0000 mg | ORAL_TABLET | Freq: Two times a day (BID) | ORAL | 0 refills | Status: DC

## 2022-04-11 MED ORDER — IOHEXOL 350 MG/ML SOLN
50.0000 mL | Freq: Once | INTRAVENOUS | Status: AC | PRN
  Administered 2022-04-11: 50 mL via INTRAVENOUS

## 2022-04-11 NOTE — Progress Notes (Signed)
Transition of Care [facility]) - Emergency Department Mini Assessment   Patient Details  Name: Kim Alvarez MRN: [id number] Date of Birth: 1986-02-09  Transition of Care [facility]) CM/SW Contact:    Fuller Mandril, RN Phone Number: 04/11/2022, 10:57 AM   Clinical Narrative: RNCM consulted regarding PCP establishment. Pt presented to [facility] ED today with chest pain. RNCM met with pt at bedside; pt confirms not having access to follow up care with PCP. Discussed with patient importance and benefits of establishing PCP, and not utilizing the ED for primary care needs. Pt verbalized understanding and is in agreement. Discussed other options, provided list of local  affordable PCPs.   RNCM obtained appointment on (9/13), time (0845)  and placed on After Visit Summary paperwork.  No further case management needs communicated at this time. Kynsley Whitehouse J. Clydene Laming, RN, BSN, [place] [phone]    ED Mini Assessment: What brought you to the Emergency Department? : (P) "right sided chest pain that radiates to the right shoulder, pain started tonight and woke her from sleep; pain worse with movement"  Barriers to Discharge: (P) ED PCP establishment  Barrier interventions: (P) Appt with [facility]  Means of departure: (P) Car  Interventions which prevented an admission or readmission: PCP Appointment Scheduled    Patient Contact and Communications        ,          Patient states their goals for this hospitalization and ongoing recovery are:: (P) Find out where this pain is coming from      Admission diagnosis:  chest pain: neck pain: arm pain Patient Active Problem List   Diagnosis Date Noted   Encounter for induction of labor 11/03/2018   Postpartum care following water birth vaginal delivery 3/24 02/23/2017   SVD (spontaneous vaginal delivery) : waterbirth  02/23/2017   Second-degree perineal laceration, with delivery 02/23/2017   Sickle cell trait ([place])    Chlamydia    Goiter,  nontoxic, multinodular    Abnormal Pap smear    Yeast infection    Fibroids    PCP:  System, Provider Not In Pharmacy:   [id number] [place], [place] [address]Phone:: [phone]: [phone]

## 2022-04-11 NOTE — ED Notes (Signed)
Pt transported to CT ?

## 2022-04-11 NOTE — ED Notes (Signed)
Pt verbalizes understanding of discharge instructions. Opportunity for questions and answers were provided. Pt discharged from the ED.   ?

## 2022-04-11 NOTE — ED Provider Notes (Signed)
[facility] EMERGENCY DEPARTMENT Provider Note   [name]: [id number] Arrival date & time: 04/11/22  4431     History  Chief Complaint  Patient presents with   Chest Pain    Kim Alvarez is a 36 y.o. female.  Patient is a 36 year old female with a history significant for GI evaluation for hematochezia with improvement of symptoms and no chronic medications except for birth control pills who is presenting today with a sudden onset of right chest shoulder pain that started last night around 10 PM.  Patient reports it had been a normal day she was feeling fine and she was reclined at home and went to sit up and developed a deep pain in her right upper chest shoulder area.  It is worse with position changing and taking deep breaths but not specifically worse when she moves her arm.  She feels a little pain in the scapular area as well but denies any pain radiating down her arm, numbness or tingling of her fingers.  She has not had cough, congestion.  She did take some nighttime medication when the pain started which she thinks was Tylenol PM.  She went to sleep but the pain woke her up at 2 AM and she started become very nervous.  She has had no shortness of breath, nausea vomiting.  She denies any abdominal pain.  She does not use tobacco products and denies any recent unilateral leg pain swelling or long trips.  She denies ever having symptoms like this before.  She does not have a history of asthma or use an inhaler for any reason.  She reports even now the pain is present mostly with position changing and taking a deep breath.  She denies any abdominal pain.  The history is provided by the patient and medical records.  Chest Pain      Home Medications Prior to Admission medications   Medication Sig Start Date End Date Taking? Authorizing Provider  HYDROcodone-acetaminophen (NORCO/VICODIN) 5-325 MG tablet Take 1 tablet by mouth every 6 (six) hours as needed. 04/11/22  Yes  Blanchie Dessert, MD  RIVAROXABAN Alveda Reasons) VTE STARTER PACK (15 & 20 MG) Take 15 mg by mouth 2 (two) times daily for 21 days. Follow package directions: Take one 61m tablet by mouth twice a day. On day 22, switch to one 239mtablet once a day. Take with food. 04/11/22 05/02/22 Yes Keiara Sneeringer, WhLoree FeeMD  mesalamine (ROWASA) 4 g enema Place 60 mLs (4 g total) rectally at bedtime. 04/09/21 05/09/21  JaMilus BanisterMD  Multiple Vitamins-Minerals (EMERGEN-C IMMUNE PLUS) PACK Take 1 Package by mouth daily as needed (immune boost).    [provider]      Allergies    Patient has no known allergies.    Review of Systems   Review of Systems  Cardiovascular:  Positive for chest pain.    Physical Exam Updated Vital Signs BP 114/72   Pulse 88   Temp 98.9 F (37.2 C) (Oral)   Resp (!) 22   SpO2 93%  Physical Exam Vitals and nursing note reviewed.  Constitutional:      General: She is not in acute distress.    Appearance: She is well-developed.  HENT:     Head: Normocephalic and atraumatic.  Eyes:     Pupils: Pupils are equal, round, and reactive to light.  Cardiovascular:     Rate and Rhythm: Normal rate and regular rhythm.     Heart sounds: Normal  heart sounds. No murmur heard.    No friction rub.  Pulmonary:     Effort: Pulmonary effort is normal.     Breath sounds: Normal breath sounds. No wheezing or rales.     Comments: Pain is not reproduced with palpation of the chest Chest:     Chest wall: No tenderness.  Abdominal:     General: Bowel sounds are normal. There is no distension.     Palpations: Abdomen is soft.     Tenderness: There is no abdominal tenderness. There is no guarding or rebound.  Musculoskeletal:        General: No tenderness. Normal range of motion.     Cervical back: Normal range of motion and neck supple.     Comments: No edema.  Full range of motion of the left shoulder without specific recurrence of pain.  No unilateral calf pain or swelling.   Radial pulses are intact bilaterally  Skin:    General: Skin is warm and dry.     Findings: No rash.  Neurological:     Mental Status: She is alert and oriented to person, place, and time.     Cranial Nerves: No cranial nerve deficit.  Psychiatric:        Mood and Affect: Mood normal.        Behavior: Behavior normal.     ED Results / Procedures / Treatments   Labs (all labs ordered are listed, but only abnormal results are displayed) Labs Reviewed  BASIC METABOLIC PANEL - Abnormal; Notable for the following components:      Result Value   Glucose, Bld 101 (*)    All other components within normal limits  CBC - Abnormal; Notable for the following components:   Hemoglobin 11.6 (*)    HCT 35.1 (*)    MCV 78.9 (*)    All other components within normal limits  D-DIMER, QUANTITATIVE - Abnormal; Notable for the following components:   D-Dimer, Quant 1.56 (*)    All other components within normal limits  I-STAT BETA HCG BLOOD, ED ([facility], [facility], [facility] ONLY)  TROPONIN I (HIGH SENSITIVITY)  TROPONIN I (HIGH SENSITIVITY)    EKG EKG Interpretation  Date/Time:  Thursday April 11 2022 02:45:57 EDT Ventricular Rate:  87 PR Interval:  144 QRS Duration: 70 QT Interval:  374 QTC Calculation: 450 R Axis:   43 Text Interpretation: Normal sinus rhythm Normal ECG No previous ECGs available Confirmed by Blanchie Dessert [phone]) on 04/11/2022 7:46:58 AM  Radiology CT Angio Chest Pulmonary Embolism (PE) W or WO Contrast  Result Date: 04/11/2022 CLINICAL DATA:  Pulmonary embolism (PE) suspected, positive D-dimer. Chest pain EXAM: CT ANGIOGRAPHY CHEST WITH CONTRAST TECHNIQUE: Multidetector CT imaging of the chest was performed using the standard protocol during bolus administration of intravenous contrast. Multiplanar CT image reconstructions and MIPs were obtained to evaluate the vascular anatomy. RADIATION DOSE REDUCTION: This exam was performed according to the departmental dose-optimization program  which includes automated exposure control, adjustment of the mA and/or kV according to patient size and/or use of iterative reconstruction technique. CONTRAST:  47m OMNIPAQUE IOHEXOL 350 MG/ML SOLN COMPARISON:  None Available. FINDINGS: Cardiovascular: Small pulmonary embolus is seen in the right lower lobe, image 71 of series 5. No additional filling defects are seen. Heart is normal size. Aorta is normal caliber. Mediastinum/Nodes: No mediastinal, hilar, or axillary adenopathy. Trachea and esophagus are unremarkable. Thyroid unremarkable. Lungs/Pleura: Peripheral airspace opacity in the right lower lobe could reflect pulmonary infarct. No effusions.  Upper Abdomen: No acute findings Musculoskeletal: Chest wall soft tissues are unremarkable. No acute bony abnormality. Review of the MIP images confirms the above findings. IMPRESSION: Small right lower lobe pulmonary embolus. Peripheral airspace opacity in the right lower lobe could reflect early pulmonary infarct. These results were called by telephone at the time of interpretation on 04/11/2022 at 9:25 am to provider [facility] , who verbally acknowledged these results. Electronically Signed   By: Rolm Baptise M.D.   On: 04/11/2022 09:28   DG Chest 2 View  Result Date: 04/11/2022 CLINICAL DATA:  Chest pain EXAM: CHEST - 2 VIEW COMPARISON:  None Available. FINDINGS: Cardiac shadows within normal limits. Lungs are well aerated with minimal right basilar atelectasis. Mild S-shaped scoliosis of thoracolumbar spine is seen. IMPRESSION: Minimal right basilar atelectasis. Electronically Signed   By: Inez Catalina M.D.   On: 04/11/2022 03:12    Procedures Procedures    Medications Ordered in ED Medications  rivaroxaban (XARELTO) Education Kit for DVT/PE patients (has no administration in time range)  Rivaroxaban (XARELTO) tablet 15 mg (has no administration in time range)  ketorolac (TORADOL) 15 MG/ML injection 15 mg (15 mg Intravenous Given 04/11/22 0804)   fentaNYL (SUBLIMAZE) injection 25 mcg (25 mcg Intravenous Given 04/11/22 0850)  ondansetron (ZOFRAN) injection 4 mg (4 mg Intravenous Given 04/11/22 0849)  iohexol (OMNIPAQUE) 350 MG/ML injection 50 mL (50 mLs Intravenous Contrast Given 04/11/22 0920)    ED Course/ Medical Decision Making/ A&P                           Medical Decision Making Amount and/or Complexity of Data Reviewed External Data Reviewed: notes.    Details: Gi notes Labs: ordered. Decision-making details documented in ED Course. Radiology: ordered and independent interpretation performed. Decision-making details documented in ED Course. ECG/medicine tests: ordered and independent interpretation performed. Decision-making details documented in ED Course.  Risk Prescription drug management.   Pt with multiple medical problems and comorbidities and presenting today with a complaint that caries a high risk for morbidity and mortality.  Patient presenting here with nonspecific chest pain.  Her pain is on the right side and in the upper chest but does not radiate into her arm.  It is pleuritic in nature and with positioning but is not reproduced with range of motion of the arm or palpation.  Vascularly intact.  Patient denies any infectious symptoms and low suspicion for pneumonia, myocarditis or pericarditis.  Also patient is otherwise healthy and low suspicion for ACS, dissection.  Patient is low risk Wells criteria but is on a birth control pill with estrogen and given the nature of her pain feel that she needs a D-dimer to rule out PE.  Also c/o for spontaneous PTX vs pleurisy vs msk pain.  I independently interpreted patient's EKG and labs today.  Her EKG is within normal limits, troponin x2, hCG, BMP and CBC are all within normal limits.  I have independently visualized and interpreted pt's images today.  CXR without obvious PTX or acute findings.  Radiology reported minimal right basilar atelectasis most likely from shallow  breathing.  Pt given pain control.  Dimer is pending.  11:36 AM D-dimer is positive.  Pt had no pain relief with toradol.  Pt given a small dose of fentanyl and CTA pending.  11:36 AM CTA did show evidence of a PE on the right side which is subsegmental without evidence of heart strain but may have  a small area of pulmonary infarct.  Patient has no leg pain or swelling that would indicate need of Dopplers at this time.  Suspect birth control would be her only risk of causing this.  She has not had any recent immobilization.  Patient does not have a PCP and transition of care was consulted.  Have an appointment for the patient on 04/24/2022.  She was started on Xarelto and pharmacy gave her further instructions about that.  At this time patient is hemodynamically stable, well-appearing with sats at 100% on room air.  Feel that she is stable for discharge home with ongoing anticoagulation and outpatient work-up.  Patient and her husband are comfortable with this plan.  She was given return precautions.          Final Clinical Impression(s) / ED Diagnoses Final diagnoses:  Other acute pulmonary embolism without acute cor pulmonale ([place])    Rx / DC Orders ED Discharge Orders          Ordered    RIVAROXABAN (XARELTO) VTE STARTER PACK (15 & 20 MG)  2 times daily        04/11/22 1128    HYDROcodone-acetaminophen (NORCO/VICODIN) 5-325 MG tablet  Every 6 hours PRN        04/11/22 1133              Blanchie Dessert, MD 04/11/22 1136

## 2022-04-11 NOTE — ED Triage Notes (Signed)
Pt c/o right sided chest pain that radiates to the right shoulder, pain started tonight and woke her from sleep. Pain worse with movement.

## 2022-04-11 NOTE — Discharge Instructions (Addendum)
You were found to have a blood clot in one of the branches of your lung today.  It thankfully is not causing any damage to your heart and your oxygen looks good.  You were started on a blood thinner to help your body break up the clot and ensure you do not get any further clots.  At this time it is most likely that the clot is coming from you being on birth control and you need to discontinue that at this time.  When you take the Xarelto you need to make sure you are taking it with food.  For the first 21 days you take it twice a day and after that is only once a day.  If you suddenly start developing severe shortness of breath, passing out you should return to the emergency room.  You will get intermittent pain for the next 2 weeks to a month.  It is okay to take Tylenol for the pain but do not take ibuprofen because you are on the blood thinner.  Information on my medicine - XARELTO (rivaroxaban)  WHY WAS XARELTO PRESCRIBED FOR YOU? Xarelto was prescribed to treat blood clots that may have been found in the veins of your legs (deep vein thrombosis) or in your lungs (pulmonary embolism) and to reduce the risk of them occurring again.  What do you need to know about Xarelto? The starting dose is one 15 mg tablet taken TWICE daily with food for the FIRST 21 DAYS then on 04/01/22    the dose is changed to one 20 mg tablet taken ONCE A DAY with your evening meal.  DO NOT stop taking Xarelto without talking to the health care provider who prescribed the medication.  Refill your prescription for 20 mg tablets before you run out.  After discharge, you should have regular check-up appointments with your healthcare provider that is prescribing your Xarelto.  In the future your dose may need to be changed if your kidney function changes by a significant amount.  What do you do if you miss a dose? If you are taking Xarelto TWICE DAILY and you miss a dose, take it as soon as you remember. You may take two  15 mg tablets (total 30 mg) at the same time then resume your regularly scheduled 15 mg twice daily the next day.  If you are taking Xarelto ONCE DAILY and you miss a dose, take it as soon as you remember on the same day then continue your regularly scheduled once daily regimen the next day. Do not take two doses of Xarelto at the same time.   Important Safety Information Xarelto is a blood thinner medicine that can cause bleeding. You should call your healthcare provider right away if you experience any of the following: Bleeding from an injury or your nose that does not stop. Unusual colored urine (red or dark brown) or unusual colored stools (red or black). Unusual bruising for unknown reasons. A serious fall or if you hit your head (even if there is no bleeding).  Some medicines may interact with Xarelto and might increase your risk of bleeding while on Xarelto. To help avoid this, consult your healthcare provider or pharmacist prior to using any new prescription or non-prescription medications, including herbals, vitamins, non-steroidal anti-inflammatory drugs (NSAIDs) and supplements.  This website has more information on Xarelto: [email or web address]

## 2022-04-12 DIAGNOSIS — I2699 Other pulmonary embolism without acute cor pulmonale: Secondary | ICD-10-CM

## 2022-04-12 DIAGNOSIS — K5190 Ulcerative colitis, unspecified, without complications: Secondary | ICD-10-CM | POA: Insufficient documentation

## 2022-04-12 HISTORY — DX: Other pulmonary embolism without acute cor pulmonale: I26.99

## 2022-04-17 DIAGNOSIS — F4320 Adjustment disorder, unspecified: Secondary | ICD-10-CM | POA: Diagnosis not present

## 2022-04-18 ENCOUNTER — Telehealth: Payer: Self-pay | Admitting: *Deleted

## 2022-04-18 ENCOUNTER — Other Ambulatory Visit: Payer: Self-pay

## 2022-04-18 ENCOUNTER — Encounter (HOSPITAL_COMMUNITY): Payer: Self-pay

## 2022-04-18 ENCOUNTER — Emergency Department (HOSPITAL_COMMUNITY)
Admission: EM | Admit: 2022-04-18 | Discharge: 2022-04-18 | Disposition: A | Discharge status: Home / Self Care | Payer: BC Managed Care – PPO | Attending: Emergency Medicine | Admitting: Emergency Medicine

## 2022-04-18 ENCOUNTER — Emergency Department (HOSPITAL_COMMUNITY): Payer: BC Managed Care – PPO

## 2022-04-18 DIAGNOSIS — I2699 Other pulmonary embolism without acute cor pulmonale: Secondary | ICD-10-CM | POA: Diagnosis not present

## 2022-04-18 DIAGNOSIS — R918 Other nonspecific abnormal finding of lung field: Secondary | ICD-10-CM | POA: Diagnosis not present

## 2022-04-18 DIAGNOSIS — Z7901 Long term (current) use of anticoagulants: Secondary | ICD-10-CM | POA: Diagnosis not present

## 2022-04-18 DIAGNOSIS — R0602 Shortness of breath: Secondary | ICD-10-CM | POA: Diagnosis not present

## 2022-04-18 LAB — CBC
HCT: 38.5 % (ref 36.0–46.0)
Hemoglobin: 12.6 g/dL (ref 12.0–15.0)
MCH: 25.9 pg — ABNORMAL LOW (ref 26.0–34.0)
MCHC: 32.7 g/dL (ref 30.0–36.0)
MCV: 79.2 fL — ABNORMAL LOW (ref 80.0–100.0)
Platelets: 392 10*3/uL (ref 150–400)
RBC: 4.86 MIL/uL (ref 3.87–5.11)
RDW: 12.9 % (ref 11.5–15.5)
WBC: 7 10*3/uL (ref 4.0–10.5)
nRBC: 0 % (ref 0.0–0.2)

## 2022-04-18 LAB — BASIC METABOLIC PANEL
Anion gap: 6 (ref 5–15)
BUN: 11 mg/dL (ref 6–20)
CO2: 26 mmol/L (ref 22–32)
Calcium: 9 mg/dL (ref 8.9–10.3)
Chloride: 107 mmol/L (ref 98–111)
Creatinine, Ser: 0.76 mg/dL (ref 0.44–1.00)
GFR, Estimated: >60 mL/min (ref >60)
Glucose, Bld: 81 mg/dL (ref 70–99)
Potassium: 4 mmol/L (ref 3.5–5.1)
Sodium: 139 mmol/L (ref 135–145)

## 2022-04-18 NOTE — ED Provider Notes (Addendum)
[name]   [name]: [id number] Arrival date & time: 04/18/22  1044     History  Chief Complaint  Patient presents with   Chest Pain   Shortness of Breath    Kim Alvarez is a 36 y.o. female.  Patient with history of ulcerative colitis presents today with complaints of persistent chest discomfort and shortness of breath.  She states that she was seen for same on 8/31 and was diagnosed with a PE. She was discharged for same with narcotics and xarelto which she has been taking as prescribed without any missed doses. She states that since then she continues to have chest pain and shortness of breath that has somewhat improved but has not gone away. She states she is concerned because she has been reading about her diagnosis and the risks of same and is worried her blood thinner isnt working. She states that she has an appointment with internal medicine here this week to establish care for continued evaluation and management of her diagnosis. She denies any hemoptysis, fevers, chills, nausea, or vomiting.  The history is provided by the patient. No language interpreter was used.  Chest Pain Associated symptoms: shortness of breath   Shortness of Breath Associated symptoms: chest pain        Home Medications Prior to Admission medications   Medication Sig Start Date End Date Taking? Authorizing Provider  HYDROcodone-acetaminophen (NORCO/VICODIN) 5-325 MG tablet Take 1 tablet by mouth every 6 (six) hours as needed. 04/11/22  Yes Blanchie Dessert, MD  RIVAROXABAN Alveda Reasons) VTE STARTER PACK (15 & 20 MG) Take 15 mg by mouth 2 (two) times daily for 21 days. Follow package directions: Take one '15mg'$  tablet by mouth twice a day. On day 22, switch to one '20mg'$  tablet once a day. Take with food. 04/11/22 05/02/22 Yes Blanchie Dessert, MD      Allergies    Patient has no known allergies.    Review of Systems   Review of Systems  Respiratory:   Positive for shortness of breath.   Cardiovascular:  Positive for chest pain.  All other systems reviewed and are negative.   Physical Exam Updated Vital Signs BP (!) 121/90   Pulse 71   Temp 99.4 F (37.4 C) (Oral)   Resp 15   Ht '5\' 4"'$  (1.626 m)   Wt 72.6 kg   LMP 04/11/2022   SpO2 100%   BMI 27.46 kg/m  Physical Exam Vitals and nursing note reviewed.  Constitutional:      General: She is not in acute distress.    Appearance: Normal appearance. She is normal weight. She is not ill-appearing, toxic-appearing or diaphoretic.  HENT:     Head: Normocephalic and atraumatic.  Cardiovascular:     Rate and Rhythm: Normal rate and regular rhythm.     Pulses:          Radial pulses are 2+ on the right side and 2+ on the left side.     Heart sounds: Normal heart sounds.  Pulmonary:     Effort: Pulmonary effort is normal. No respiratory distress.     Breath sounds: Normal breath sounds.  Abdominal:     Palpations: Abdomen is soft.  Musculoskeletal:        General: Normal range of motion.     Cervical back: Normal range of motion.     Right lower leg: No tenderness. No edema.     Left lower leg: No tenderness. No  edema.  Skin:    General: Skin is warm and dry.  Neurological:     General: No focal deficit present.     Mental Status: She is alert.  Psychiatric:        Mood and Affect: Mood normal.        Behavior: Behavior normal.     ED Results / Procedures / Treatments   Labs (all labs ordered are listed, but only abnormal results are displayed) Labs Reviewed  CBC - Abnormal; Notable for the following components:      Result Value   MCV 79.2 (*)    MCH 25.9 (*)    All other components within normal limits  BASIC METABOLIC PANEL    EKG None  Radiology DG Chest 2 View  Result Date: 04/18/2022 CLINICAL DATA:  Shortness of breath EXAM: CHEST - 2 VIEW COMPARISON:  Radiograph 04/11/2022, CT 04/11/2022 FINDINGS: Unchanged cardiomediastinal silhouette. Peripheral right  basilar opacity is not significantly changed from recent exam. There is no new airspace disease. No pleural effusion. No pneumothorax. No acute osseous abnormality. Dr. Doylene Canning thoracic curvature. IMPRESSION: Peripheral right basilar opacity, not significantly changed from recent exam. No new or worsening airspace disease. Electronically Signed   By: Maurine Simmering M.D.   On: 04/18/2022 12:25    Procedures Procedures    Medications Ordered in ED Medications - No data to display  ED Course/ Medical Decision Making/ A&P                           Medical Decision Making Amount and/or Complexity of Data Reviewed Labs: ordered. Radiology: ordered.   This patient presents to the ED for concern of persistent chest discomfort and shortness of breath after being diagnosed with PE on 8/31.   Co morbidities that complicate the patient evaluation  Hx PE   Lab Tests:  I Ordered, and personally interpreted labs.  The pertinent results include: No acute laboratory findings   Imaging Studies ordered:  I ordered imaging studies including CXR  I independently visualized and interpreted imaging which showed  Peripheral right basilar opacity, not significantly changed from recent exam. No new or worsening airspace disease. I agree with the radiologist interpretation   Cardiac Monitoring: / EKG:  The patient was maintained on a cardiac monitor.  I personally viewed and interpreted the cardiac monitored which showed an underlying rhythm of: no STEMI   Test / Admission - Considered:  Patient presents today with persistent chest discomfort and shortness of breath after being diagnosed with a PE on 8/31. She is afebrile, nontoxic-appearing, and in no acute distress with reassuring vital signs.  She has been compliant with her Xarelto and denies any missed dosages.  She is able to ambulate throughout the halls without any drop in oxygen saturation.  Patient is young and otherwise healthy.  Review of  her CT scan reveals small PE in the right lower lung.  Chest x-ray performed today did not show any worsening from previous.  Given this, patient does not need to be admitted at this time.  I have spent a substantial amount of time discussing her diagnosis and her anxiety related to same.  She is stable for discharge.  I have emphasized the importance of not missing any doses of her Xarelto at following up with her PCP at her appointment next week.  I have also discussed red flag symptoms of prompt immediate return.  Patient is understanding and amenable with  plan, patient discharged in stable condition.  Findings and plan of care discussed with supervising physician Dr. Ernesto Rutherford who is in agreement.    Final Clinical Impression(s) / ED Diagnoses Final diagnoses:  Other acute pulmonary embolism without acute cor pulmonale ([place])    Rx / DC Orders ED Discharge Orders     None     An After Visit Summary was printed and given to the patient.     Bud Face, PA-C 04/18/22 1416    Serene Kopf, Leary Roca, PA-C 04/18/22 1417    Oneal Deputy K, DO 04/18/22 1629

## 2022-04-18 NOTE — Discharge Instructions (Signed)
As we discussed, it is very important that you continue to take your Xarelto every day and do not miss any doses.  Follow-up with your primary care doctor at your appointment next week for continued evaluation and management of your symptoms.  Return if development of any new or worsening symptoms.

## 2022-04-18 NOTE — ED Triage Notes (Signed)
Pt. Stated. I came in on Aug 31 and was dx. With a blood clot and put on blood thinner. Im still having chest pain and SOb. I want to make sure Im ok and its going away.

## 2022-04-18 NOTE — Telephone Encounter (Signed)
Patient called in stating she is worried the clot in her lung is moving. Patient was seen in ED on 8/31 and dx with PE. She reports taking Xarelto as directed. She has upcoming appt to establish care at [facility] on 9/13. There are no openings before that appt. She is advised to head back to ED. She agrees to this.

## 2022-04-18 NOTE — ED Provider Triage Note (Signed)
Emergency Medicine Provider Triage Evaluation Note  Kim Alvarez , a 36 y.o. female  was evaluated in triage.  Pt complains of chest discomfort and shortness of breath. She states that she was seen for same on 8/31 and was diagnosed with a PE. She was discharged for same with narcotics and xarelto which she has been taking as prescribed without any missed doses. She states that since then she continues to have chest pain and shortness of breath that has not gone away. She states she is concerned because she has been reading about her diagnosis and the risks of same and is worried her blood thinner isnt working.   Review of Systems  Positive:  Negative:   Physical Exam  BP (!) 126/91 (BP Location: Right Arm)   Pulse 94   Temp 99.4 F (37.4 C) (Oral)   Resp 20   Ht '5\' 4"'$  (1.626 m)   Wt 72.6 kg   LMP 04/11/2022   SpO2 97%   BMI 27.46 kg/m  Gen:   Awake, no distress   Resp:  Normal effort, speaking in complete sentences MSK:   Moves extremities without difficulty  Other:    Medical Decision Making  Medically screening exam initiated at 11:53 AM.  Appropriate orders placed.  Kim Alvarez was informed that the remainder of the evaluation will be completed by another provider, this initial triage assessment does not replace that evaluation, and the importance of remaining in the ED until their evaluation is complete.     Bud Face, PA-C 04/18/22 1156

## 2022-04-18 NOTE — ED Notes (Signed)
This RN ambulated patient with pulse ox and patient's spo2 remained at 97% on room air. Patient stated that walking made her feel SOB but spo2 remained at 97-100%.

## 2022-04-18 NOTE — Telephone Encounter (Signed)
Agree, thank you

## 2022-04-18 NOTE — ED Notes (Signed)
All discharge instructions reviewed with patient including follow up care. Patient verbalized understanding of same and had no other questions. Patient stable and ambulatory at time of discharge.  

## 2022-04-24 ENCOUNTER — Ambulatory Visit (INDEPENDENT_AMBULATORY_CARE_PROVIDER_SITE_OTHER): Payer: BC Managed Care – PPO | Admitting: Student

## 2022-04-24 ENCOUNTER — Other Ambulatory Visit: Payer: Self-pay

## 2022-04-24 ENCOUNTER — Encounter: Payer: Self-pay | Admitting: Student

## 2022-04-24 VITALS — BP 113/69 | HR 79 | Temp 97.9°F | Ht 64.0 in | Wt 162.4 lb

## 2022-04-24 DIAGNOSIS — I2693 Single subsegmental pulmonary embolism without acute cor pulmonale: Secondary | ICD-10-CM

## 2022-04-24 DIAGNOSIS — Z0000 Encounter for general adult medical examination without abnormal findings: Secondary | ICD-10-CM | POA: Insufficient documentation

## 2022-04-24 DIAGNOSIS — K51211 Ulcerative (chronic) proctitis with rectal bleeding: Secondary | ICD-10-CM | POA: Diagnosis not present

## 2022-04-24 DIAGNOSIS — Z23 Encounter for immunization: Secondary | ICD-10-CM

## 2022-04-24 HISTORY — DX: Encounter for general adult medical examination without abnormal findings: Z00.00

## 2022-04-24 MED ORDER — RIVAROXABAN 20 MG PO TABS: 20.0000 mg | ORAL_TABLET | Freq: Every day | ORAL | 4 refills | Status: DC

## 2022-04-24 NOTE — Patient Instructions (Signed)
Today we discussed blood clots and ulcerative colitis.  Please continue your Xarelto daily.  Please follow up with gastroenterology as soon as possible.  The clinic contact information is below. Call for any concerns.  Labs and tests ordered today:  Lab Orders  No laboratory test(s) ordered today     Referrals ordered today:   Referral Orders  No referral(s) requested today     Take your medications as prescribed:   Allergies as of 04/24/2022   No Known Allergies      Medication List        Accurate as of April 24, 2022 10:21 AM. If you have any questions, ask your nurse or doctor.          HYDROcodone-acetaminophen 5-325 MG tablet Commonly known as: NORCO/VICODIN Take 1 tablet by mouth every 6 (six) hours as needed.   Rivaroxaban Stater Pack (15 mg and 20 mg) Commonly known as: XARELTO STARTER PACK Take 15 mg by mouth 2 (two) times daily for 21 days. Follow package directions: Take one '15mg'$  tablet by mouth twice a day. On day 22, switch to one '20mg'$  tablet once a day. Take with food.        Follow up: 6 months.  Please call our clinic at [phone] Monday through Friday from 9 am to 4 pm if you have any questions or concerns. If after hours or on the weekend, call the main hospital number and ask for the Internal Medicine Resident On-Call. If you need medication refills, please notify your pharmacy one week in advance and they will send [place] a request.   Best, Nani Gasser, [place]

## 2022-04-24 NOTE — Assessment & Plan Note (Signed)
Patient presented to the emergency department for chest pain and right shoulder pain on 04/11/2022.  She was diagnosed with a pulmonary embolism thought to be due to estrogen containing birth control, which she has since stopped.  Today she has some lingering pain and dyspnea on exertion but overall her symptoms are improving.  She is on Xarelto and tolerating the medicine well.  She does not smoke.  She has no personal history of clotting disorders or miscarriages.  She has no known family history of clotting disorders.  On exam she is well-appearing.  This patient presents for follow-up after a pulmonary embolism associated with an identifiable risk factor, namely estrogen.  She has since stopped taking her OCP.  She has no other risk factors for the development of DVT, thus I do not feel like evaluation for inherited risk factors is warranted.  She is doing well today.  I will order Xarelto to be continued through the end of February 2024 to complete 6 months of therapy. - Continue rivaroxaban 15 mg twice daily through 05/02/2022.  At that point switch to 20 mg once per day through the end of February 2024.
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
## 2022-04-24 NOTE — Assessment & Plan Note (Addendum)
Patient was evaluated for bloody diarrhea last year with EGD and colonoscopy.  Colonoscopy was notable for distal ulcerative colitis.  Diagnosis was not effectively communicated to the patient, thus she was lost to follow-up with GI and has not received any treatment for her condition.  From her perspective, this condition is stable.  However she does report several recent episodes of fecal urgency.  Otherwise she denies diarrhea and blood in her stool.  No fevers.  Exam is notable for some right lower quadrant tenderness.  Although she reports that her symptoms are well controlled it is concerning that she complains of recent fecal urgency.  She also has some right lower quadrant tenderness without peritoneal signs on exam, which could be due to colonic inflammation.  Because of the significant morbidity associated with ulcerative colitis I recommended to this patient that she reestablish her relationship with gastroenterology. - Referral placed
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
## 2022-05-07 NOTE — Progress Notes (Signed)
Internal Medicine Clinic Attending  I saw and evaluated the patient.  I personally confirmed the key portions of the history and exam documented by the resident  and I reviewed pertinent patient test results.  The assessment, diagnosis, and plan were formulated together and I agree with the documentation in the resident's note.  

## 2022-05-09 DIAGNOSIS — F4320 Adjustment disorder, unspecified: Secondary | ICD-10-CM | POA: Diagnosis not present

## 2022-05-22 ENCOUNTER — Other Ambulatory Visit: Payer: Self-pay | Admitting: Student

## 2022-05-22 MED ORDER — RIVAROXABAN 20 MG PO TABS: 20.0000 mg | ORAL_TABLET | Freq: Every day | ORAL | 2 refills | Status: DC

## 2022-06-20 ENCOUNTER — Ambulatory Visit: Payer: BC Managed Care – PPO | Admitting: Physician Assistant

## 2022-07-24 ENCOUNTER — Ambulatory Visit (INDEPENDENT_AMBULATORY_CARE_PROVIDER_SITE_OTHER): Payer: BC Managed Care – PPO | Admitting: Physician Assistant

## 2022-07-24 ENCOUNTER — Encounter: Payer: Self-pay | Admitting: Physician Assistant

## 2022-07-24 ENCOUNTER — Ambulatory Visit
Admission: RE | Admit: 2022-07-24 | Discharge: 2022-07-24 | Disposition: A | Discharge status: Home / Self Care | Payer: BC Managed Care – PPO | Source: Ambulatory Visit | Attending: Physician Assistant | Admitting: Physician Assistant

## 2022-07-24 VITALS — BP 116/70 | HR 81 | Ht 64.0 in | Wt 171.2 lb

## 2022-07-24 VITALS — BP 107/73 | HR 82 | Temp 99.0°F | Resp 16

## 2022-07-24 DIAGNOSIS — K51219 Ulcerative (chronic) proctitis with unspecified complications: Secondary | ICD-10-CM | POA: Diagnosis not present

## 2022-07-24 DIAGNOSIS — J020 Streptococcal pharyngitis: Secondary | ICD-10-CM

## 2022-07-24 LAB — POCT RAPID STREP A (OFFICE): Rapid Strep A Screen: POSITIVE — AB

## 2022-07-24 MED ORDER — AMOXICILLIN 500 MG PO CAPS: 500.0000 mg | ORAL_CAPSULE | Freq: Three times a day (TID) | ORAL | 0 refills | Status: DC

## 2022-07-24 MED ORDER — MESALAMINE 4 G RE ENEM
4.0000 g | ENEMA | Freq: Every day | RECTAL | 2 refills | Status: DC
Start: 2022-07-24 — End: 2022-10-15

## 2022-07-24 NOTE — ED Provider Notes (Signed)
Kim Alvarez    [name]: [id number] Arrival date & time: 07/24/22  1548      History   Chief Complaint Chief Complaint  Patient presents with   Sore Throat    HPI Kim Alvarez is a 36 y.o. female.   Patient here today for evaluation of sore throat, congestion and bodyaches as well as headaches that started about 5 days ago.  She reports overall she is improving somewhat.  She does not think she has had fever.  She denies any vomiting or diarrhea.  She has been taking over-the-counter medication with mild relief of symptoms.  The history is provided by the patient.  Sore Throat Associated symptoms include headaches. Pertinent negatives include no abdominal pain and no shortness of breath.    Past Medical History:  Diagnosis Date   Abnormal Pap smear 2007   Allergy    Chlamydia 2013   treated   Family history of sickle cell anemia (Hgb SS) in mother    FHx: cancer    H/O varicella    IUD (intrauterine device) in place 08/2010   Sickle cell trait ([place])    Yeast infection     Patient Active Problem List   Diagnosis Date Noted   Routine adult health maintenance 04/24/2022   Pulmonary embolism ([place]) 04/12/2022   Ulcerative colitis ([place]) 04/12/2022   Encounter for induction of labor 11/03/2018   Postpartum Alvarez following water birth vaginal delivery 3/24 02/23/2017   SVD (spontaneous vaginal delivery) : waterbirth  02/23/2017   Second-degree perineal laceration, with delivery 02/23/2017   Sickle cell trait ([facility])    Chlamydia    Goiter, nontoxic, multinodular    Abnormal Pap smear    Yeast infection    Fibroids     Past Surgical History:  Procedure Laterality Date   NO PAST SURGERIES     PULMONARY EMBOLISM SURGERY     WISDOM TOOTH EXTRACTION Bilateral     OB History     Gravida  4   Para  3   Term  1   Preterm  2   AB  1   Living  3      SAB      IAB      Ectopic      Multiple  0   Live Births  2        Obstetric Comments   Water birth delivery           Home Medications    Prior to Admission medications   Medication Sig Start Date End Date Taking? Authorizing Provider  amoxicillin (AMOXIL) 500 MG capsule Take 1 capsule (500 mg total) by mouth 3 (three) times daily. 07/24/22  Yes Francene Finders, PA-C  HYDROcodone-acetaminophen (NORCO/VICODIN) 5-325 MG tablet Take 1 tablet by mouth every 6 (six) hours as needed. 04/11/22   Blanchie Dessert, MD  mesalamine (ROWASA) 4 g enema Place 60 mLs (4 g total) rectally at bedtime. 07/24/22   Levin Erp, PA  rivaroxaban (XARELTO) 20 MG TABS tablet Take 1 tablet (20 mg total) by mouth daily with supper. Patient not taking: Reported on 07/24/2022 05/22/22   Nani Gasser, MD    Family History Family History  Problem Relation Age of Onset   Sickle cell anemia Mother        alive   Colon cancer Father    Sickle cell anemia Maternal Aunt        alive   Sickle cell anemia Maternal Aunt  died   Sickle cell anemia Maternal Uncle        alive   Sickle cell anemia Maternal Uncle        died   Breast cancer Paternal Aunt    Esophageal cancer Neg Hx    Pancreatic cancer Neg Hx    Stomach cancer Neg Hx    Liver disease Neg Hx     Social History Social History   Tobacco Use   Smoking status: Never    Passive exposure: Never   Smokeless tobacco: Never  Vaping Use   Vaping Use: Never used  Substance Use Topics   Alcohol use: Yes    Alcohol/week: 0.0 standard drinks of alcohol    Comment: socially    Drug use: No     Allergies   Patient has no known allergies.   Review of Systems Review of Systems  Constitutional:  Negative for chills and fever.  HENT:  Positive for congestion and sore throat. Negative for ear pain.   Eyes:  Negative for discharge and redness.  Respiratory:  Negative for cough, shortness of breath and wheezing.   Gastrointestinal:  Negative for abdominal pain, diarrhea, nausea and vomiting.  Musculoskeletal:   Positive for myalgias.  Neurological:  Positive for headaches.     Physical Exam Triage Vital Signs ED Triage Vitals [07/24/22 1632]  Enc Vitals Group     BP 107/73     Pulse Rate 82     Resp 16     Temp 99 F (37.2 C)     Temp Source Oral     SpO2 97 %     Weight      Height      Head Circumference      Peak Flow      Pain Score 6     Pain Loc      Pain Edu?      Excl. in [place]?    No data found.  Updated Vital Signs BP 107/73 (BP Location: Left Arm)   Pulse 82   Temp 99 F (37.2 C) (Oral)   Resp 16   SpO2 97%     Physical Exam Vitals and nursing note reviewed.  Constitutional:      General: She is not in acute distress.    Appearance: Normal appearance. She is not ill-appearing.  HENT:     Head: Normocephalic and atraumatic.     Nose: Congestion (mild) present.     Mouth/Throat:     Mouth: Mucous membranes are moist.     Pharynx: Posterior oropharyngeal erythema present. No oropharyngeal exudate.  Eyes:     Conjunctiva/sclera: Conjunctivae normal.  Cardiovascular:     Rate and Rhythm: Normal rate and regular rhythm.     Heart sounds: Normal heart sounds. No murmur heard. Pulmonary:     Effort: Pulmonary effort is normal. No respiratory distress.     Breath sounds: Normal breath sounds. No wheezing, rhonchi or rales.  Skin:    General: Skin is warm and dry.  Neurological:     Mental Status: She is alert.  Psychiatric:        Mood and Affect: Mood normal.        Thought Content: Thought content normal.      UC Treatments / Results  Labs (all labs ordered are listed, but only abnormal results are displayed) Labs Reviewed  POCT RAPID STREP A (OFFICE) - Abnormal; Notable for the following components:      Result Value  Rapid Strep A Screen Positive (*)    All other components within normal limits    EKG   Radiology No results found.  Procedures Procedures (including critical Alvarez time)  Medications Ordered in UC Medications - No data  to display  Initial Impression / Assessment and Plan / UC Course  I have reviewed the triage vital signs and the nursing notes.  Pertinent labs & imaging results that were available during my Alvarez of the patient were reviewed by me and considered in my medical decision making (see chart for details).    Rapid strep test positive in office.  Will treat with amoxicillin and recommended further evaluation if symptoms fail to improve or worsen.  Okay to continue over-the-counter medication for symptom relief.  Final Clinical Impressions(s) / UC Diagnoses   Final diagnoses:  Streptococcal sore throat   Discharge Instructions   None    ED Prescriptions     Medication Sig Dispense Auth. Provider   amoxicillin (AMOXIL) 500 MG capsule Take 1 capsule (500 mg total) by mouth 3 (three) times daily. 21 capsule Francene Finders, PA-C      PDMP not reviewed this encounter.   Francene Finders, PA-C 07/24/22 1759

## 2022-07-24 NOTE — ED Triage Notes (Signed)
Pt c/o sore throat, neck stiffness, headaches, body aches   Improves w/ tylenol. Progressively getting better.   Onset ~ 5 days ago

## 2022-07-24 NOTE — Patient Instructions (Addendum)
We have sent the following medications to your pharmacy for you to pick up at your convenience: Rowasa enema   Follow-up on : 08/26/22 at 11:00 am   _______________________________________________________  If you are age 35 or older, your body mass index should be between 23-30. Your Body mass index is 29.39 kg/m. If this is out of the aforementioned range listed, please consider follow up with your Primary Care Provider.  If you are age 28 or younger, your body mass index should be between 19-25. Your Body mass index is 29.39 kg/m. If this is out of the aformentioned range listed, please consider follow up with your Primary Care Provider.   ________________________________________________________  The [facility] providers would like to encourage you to use [facility] to communicate with providers for non-urgent requests or questions.  Due to long hold times on the telephone, sending your provider a message by [facility] may be a faster and more efficient way to get a response.  Please allow 48 business hours for a response.  Please remember that this is for non-urgent requests.  _______________________________________________________  Thank you for choosing me and [facility].  Ellouise Newer PA-C

## 2022-07-24 NOTE — Progress Notes (Signed)
Chief Complaint:  Abdominal pain  HPI:    Kim Alvarez is a  36 y/o AA female, previously assigned to Dr. Ardis Hughs, with a past medical history as listed below including sickle cell trait, who was referred to me by Nani Gasser, MD for a complaint of abdominal pain.      03/02/2021 office visit with me to discuss change in bowel habits to watery and red with an increase in generalized abdominal soreness.  Also some "neck soreness and tightness" and occasional trouble swallowing.  She had vague history of possible colon cancer in her father and was unsure about the rest.  At that time discussed GI pathogen panel and then possibly EGD and colonoscopy.  Gave her a trial of Dicyclomine.    04/06/2021 EGD with mild nonspecific gastritis and otherwise normal.  Biopsy showed mild chronic gastritis.  Colonoscopy with mild to moderate distal colitis (proctitis) and otherwise normal.  Biopsy showed mildly active chronic colitis consistent with IBD.  Patient started on Rowasa enemas 1 enema nightly.    04/18/2022 CBC with MCV minimally decreased at 79.2 and otherwise normal.  BMP normal.    04/24/2022 office visit with internal medicine and discussed pulmonary embolism.  She is on Xarelto.  Also discussed ulcerative colitis.  Discussed fecal urgency.    Today, the patient presents to clinic and tells me that she apparently discussed with someone about recommendations to be on Rowasa enemas but she never actually took these.  Tells me she is not having any further rectal bleeding for which she was seen initially.  She does have occasional diarrhea and occasional abdominal pain when someone presses on her stomach in different parts.  Nothing really bothers her consistently.    Patient has 4 children.    Denies fever, chills, weight loss, further blood in her stool, nausea or vomiting.  Past Medical History:  Diagnosis Date   Abnormal Pap smear 2007   Allergy    Chlamydia 2013   treated   Family history of sickle  cell anemia (Hgb SS) in mother    FHx: cancer    H/O varicella    IUD (intrauterine device) in place 08/2010   Sickle cell trait ([place])    Yeast infection     Past Surgical History:  Procedure Laterality Date   NO PAST SURGERIES     WISDOM TOOTH EXTRACTION Bilateral     Current Outpatient Medications  Medication Sig Dispense Refill   HYDROcodone-acetaminophen (NORCO/VICODIN) 5-325 MG tablet Take 1 tablet by mouth every 6 (six) hours as needed. 10 tablet 0   rivaroxaban (XARELTO) 20 MG TABS tablet Take 1 tablet (20 mg total) by mouth daily with supper. 30 tablet 2   No current facility-administered medications for this visit.    Allergies as of 07/24/2022   (No Known Allergies)    Family History  Problem Relation Age of Onset   Sickle cell anemia Mother        alive   Colon cancer Father    Sickle cell anemia Maternal Aunt        alive   Sickle cell anemia Maternal Aunt        died   Sickle cell anemia Maternal Uncle        alive   Sickle cell anemia Maternal Uncle        died   Breast cancer Paternal Aunt    Esophageal cancer Neg Hx    Pancreatic cancer Neg Hx    Stomach  cancer Neg Hx    Liver disease Neg Hx     Social History   Socioeconomic History   Marital status: Married    Spouse name: Not on file   Number of children: Not on file   Years of education: Not on file   Highest education level: Not on file  Occupational History   Not on file  Tobacco Use   Smoking status: Never    Passive exposure: Never   Smokeless tobacco: Never  Vaping Use   Vaping Use: Never used  Substance and Sexual Activity   Alcohol use: Yes    Alcohol/week: 0.0 standard drinks of alcohol    Comment: socially    Drug use: No   Sexual activity: Yes    Partners: Male    Birth control/protection: None    Comment: last IC- this morning   Other Topics Concern   Not on file  Social History Narrative   Not on file   Social Determinants of Health   Financial Resource  Strain: Not on file  Food Insecurity: Not on file  Transportation Needs: Not on file  Physical Activity: Not on file  Stress: Not on file  Social Connections: Not on file  Intimate Partner Violence: Not on file    Review of Systems:    Constitutional: No weight loss, fever or chills Cardiovascular: No chest pain Respiratory: No SOB Gastrointestinal: See HPI and otherwise negative   Physical Exam:  Vital signs: BP 116/70   Pulse 81   Ht '5\' 4"'$  (1.626 m)   Wt 171 lb 4 oz (77.7 kg)   BMI 29.39 kg/m    Constitutional:   Pleasant AA female appears to be in NAD, Well developed, Well nourished, alert and cooperative Respiratory: Respirations even and unlabored. Lungs clear to auscultation bilaterally.   No wheezes, crackles, or rhonchi.  Cardiovascular: Normal S1, S2. No MRG. Regular rate and rhythm. No peripheral edema, cyanosis or pallor.  Gastrointestinal:  Soft, nondistended, mild LUQ ttp, No rebound or guarding. Normal bowel sounds. No appreciable masses or hepatomegaly. Rectal:  Not performed.  Psychiatric: Oriented to person, place and time. Demonstrates good judgement and reason without abnormal affect or behaviors.  RELEVANT LABS AND IMAGING: CBC    Component Value Date/Time   WBC 7.0 04/18/2022 1201   RBC 4.86 04/18/2022 1201   HGB 12.6 04/18/2022 1201   HCT 38.5 04/18/2022 1201   PLT 392 04/18/2022 1201   MCV 79.2 (L) 04/18/2022 1201   MCH 25.9 (L) 04/18/2022 1201   MCHC 32.7 04/18/2022 1201   RDW 12.9 04/18/2022 1201   LYMPHSABS 2.2 01/28/2014 1453   MONOABS 0.5 01/28/2014 1453   EOSABS 0.2 01/28/2014 1453   BASOSABS 0.0 01/28/2014 1453    CMP     Component Value Date/Time   NA 139 04/18/2022 1201   K 4.0 04/18/2022 1201   CL 107 04/18/2022 1201   CO2 26 04/18/2022 1201   GLUCOSE 81 04/18/2022 1201   BUN 11 04/18/2022 1201   CREATININE 0.76 04/18/2022 1201   CALCIUM 9.0 04/18/2022 1201   GFRNONAA >60 04/18/2022 1201    Assessment: 1.  Abdominal  pain: Occasional pain when someone presses on her abdomen in different places and occasional diarrhea; consider relation to below versus IBS versus gas 2.  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  Discussed diagnosis with the patient and how this is typically chronic  and ongoing and related to her immune system.  Explained that typically patients are on medication for this chronically.  She is not really having any symptoms and is not sure she needs to be on anything. 2.  Discussed that we could repeat colonoscopy now to see if she has any further disease or exactly what it looks like versus trying Rowasa enemas to see if this makes any change in what she is experiencing.  She would prefer to try the Rowasa enemas. 3.  Prescribed Rowasa enemas nightly for the next 2 to 3 months.  Prescribed #30 with 3 refills. 4.  Patient to follow in clinic with me in 4 to 6 weeks.  At that time we can discuss if she feels any different.  If she is not then would likely recommend repeat colonoscopy to evaluate what is exactly going on vs flex sigmoidoscopy.  Note was sent to Dr. Silverio Decamp in lieu of Dr. Ardis Hughs absence.  Kim Newer, PA-C [facility] 07/24/2022, 10:53 AM  Cc: Nani Gasser, MD

## 2022-08-26 ENCOUNTER — Ambulatory Visit: Payer: BC Managed Care – PPO | Admitting: Physician Assistant

## 2022-09-10 ENCOUNTER — Encounter: Payer: Self-pay | Admitting: Physician Assistant

## 2022-09-10 ENCOUNTER — Ambulatory Visit (INDEPENDENT_AMBULATORY_CARE_PROVIDER_SITE_OTHER): Payer: BC Managed Care – PPO | Admitting: Physician Assistant

## 2022-09-10 ENCOUNTER — Telehealth: Payer: Self-pay

## 2022-09-10 VITALS — BP 102/60 | HR 58 | Ht 64.0 in | Wt 174.0 lb

## 2022-09-10 DIAGNOSIS — K51219 Ulcerative (chronic) proctitis with unspecified complications: Secondary | ICD-10-CM | POA: Diagnosis not present

## 2022-09-10 NOTE — Patient Instructions (Addendum)
You will be contacted by our office prior to your procedure for directions on holding your Xarelto.  If you do not hear from our office 1 week prior to your scheduled procedure, please call [phone] to discuss.   You have been scheduled for a flexible sigmoidoscopy. Please follow the written instructions given to you at your visit today. If you use inhalers (even only as needed), please bring them with you on the day of your procedure.   If your blood pressure at your visit was 140/90 or greater, please contact your primary care physician to follow up on this.  _______________________________________________________  If you are age 57 or older, your body mass index should be between 23-30. Your Body mass index is 29.87 kg/m. If this is out of the aforementioned range listed, please consider follow up with your Primary Care Provider.  If you are age 71 or younger, your body mass index should be between 19-25. Your Body mass index is 29.87 kg/m. If this is out of the aformentioned range listed, please consider follow up with your Primary Care Provider.   ________________________________________________________  The [facility] providers would like to encourage you to use [facility] to communicate with providers for non-urgent requests or questions.  Due to long hold times on the telephone, sending your provider a message by [facility] may be a faster and more efficient way to get a response.  Please allow 48 business hours for a response.  Please remember that this is for non-urgent requests.   Due to recent changes in healthcare laws, you may see the results of your imaging and laboratory studies on MyChart before your provider has had a chance to review them.  We understand that in some cases there may be results that are confusing or concerning to you. Not all laboratory results come back in the same time frame and the provider may be waiting for multiple results in order to interpret others.   Please give [place] 48 hours in order for your provider to thoroughly review all the results before contacting the office for clarification of your results.    Thank you for entrusting me with your care and choosing [facility].  Ellouise Newer PA-C

## 2022-09-10 NOTE — Progress Notes (Signed)
Chief Complaint: Follow-up ulcerative proctitis  HPI:    Kim Alvarez is a 37 year old African-American female with a past medical history as listed below including ulcerative proctitis, previously assigned to Dr. Ardis Hughs of note sent to Dr. Silverio Decamp last visit, who returns to clinic today for follow-up of ulcerative proctitis.    03/02/2021 office visit with me to discuss change in bowel habits to watery and red with an increase in generalized abdominal soreness.  Also some "neck soreness and tightness" and occasional trouble swallowing.  She had vague history of possible colon cancer in her father and was unsure about the rest.  At that time discussed GI pathogen panel and then possibly EGD and colonoscopy.  Gave her a trial of Dicyclomine.    04/06/2021 EGD with mild nonspecific gastritis and otherwise normal.  Biopsy showed mild chronic gastritis.  Colonoscopy with mild to moderate distal colitis (proctitis) and otherwise normal.  Biopsy showed mildly active chronic colitis consistent with IBD.  Patient started on Rowasa enemas 1 enema nightly.    04/18/2022 CBC with MCV minimally decreased at 79.2 and otherwise normal.  BMP normal.    04/24/2022 office visit with internal medicine and discussed pulmonary embolism.  She is on Xarelto.  Also discussed ulcerative colitis.  Discussed fecal urgency.    07/24/2022 patient seen in clinic and told me she does not have any further rectal bleeding for which she was seen initially but did have occasional diarrhea and occasional abdominal pain.  Discussed diagnosis of ulcerative proctitis and we prescribed Rowasa enemas nightly for the next 2 to 3 months to see if this changes any of her symptoms.  Explained that if she did not feel any different then she would likely need repeat flex sig/colonoscopy to evaluate state of her symptoms.    Today, the patient tells me she only took about 3 of the Rowasa enemas in total because they were uncomfortable.  Denies any further  rectal bleeding.  She continues with occasional sharp abdominal pain but feels like this may be related to gas.  Interestingly also tells me she is not taking her Xarelto for previous PE.  Tells me it is very hard for her to take regular medicines.  Denies any new GI complaints or concerns.    Denies fever, chills, weight loss, blood in her stool, nausea, vomiting or symptoms that awaken her from sleep.  Past Surgical History:  Procedure Laterality Date   NO PAST SURGERIES     PULMONARY EMBOLISM SURGERY     WISDOM TOOTH EXTRACTION Bilateral     Current Outpatient Medications  Medication Sig Dispense Refill   amoxicillin (AMOXIL) 500 MG capsule Take 1 capsule (500 mg total) by mouth 3 (three) times daily. 21 capsule 0   HYDROcodone-acetaminophen (NORCO/VICODIN) 5-325 MG tablet Take 1 tablet by mouth every 6 (six) hours as needed. 10 tablet 0   mesalamine (ROWASA) 4 g enema Place 60 mLs (4 g total) rectally at bedtime. 600 mL 2   rivaroxaban (XARELTO) 20 MG TABS tablet Take 1 tablet (20 mg total) by mouth daily with supper. (Patient not taking: Reported on 07/24/2022) 30 tablet 2   No current facility-administered medications for this visit.    Allergies as of 09/10/2022   (No Known Allergies)    Family History  Problem Relation Age of Onset   Sickle cell anemia Mother        alive   Colon cancer Father    Sickle cell anemia Maternal Aunt  alive   Sickle cell anemia Maternal Aunt        died   Sickle cell anemia Maternal Uncle        alive   Sickle cell anemia Maternal Uncle        died   Breast cancer Paternal Aunt    Esophageal cancer Neg Hx    Pancreatic cancer Neg Hx    Stomach cancer Neg Hx    Liver disease Neg Hx     Social History   Socioeconomic History   Marital status: Married    Spouse name: Not on file   Number of children: Not on file   Years of education: Not on file   Highest education level: Not on file  Occupational History   Not on file   Tobacco Use   Smoking status: Never    Passive exposure: Never   Smokeless tobacco: Never  Vaping Use   Vaping Use: Never used  Substance and Sexual Activity   Alcohol use: Yes    Alcohol/week: 0.0 standard drinks of alcohol    Comment: socially    Drug use: No   Sexual activity: Yes    Partners: Male    Birth control/protection: None    Comment: last IC- this morning   Other Topics Concern   Not on file  Social History Narrative   Not on file   Social Determinants of Health   Financial Resource Strain: Not on file  Food Insecurity: Not on file  Transportation Needs: Not on file  Physical Activity: Not on file  Stress: Not on file  Social Connections: Not on file  Intimate Partner Violence: Not on file    Review of Systems:    Constitutional: No weight loss, fever or chills Cardiovascular: No chest pain Respiratory: No SOB  Gastrointestinal: See HPI and otherwise negative   Physical Exam:  Vital signs: BP 102/60   Pulse (!) 58   Ht '5\' 4"'$  (1.626 m)   Wt 174 lb (78.9 kg)   BMI 29.87 kg/m    Constitutional:   Pleasant AA female appears to be in NAD, Well developed, Well nourished, alert and cooperative Respiratory: Respirations even and unlabored. Lungs clear to auscultation bilaterally.   No wheezes, crackles, or rhonchi.  Cardiovascular: Normal S1, S2. No MRG. Regular rate and rhythm. No peripheral edema, cyanosis or pallor.  Gastrointestinal:  Soft, nondistended, nontender. No rebound or guarding. Normal bowel sounds. No appreciable masses or hepatomegaly. Rectal:  Not performed.  Psychiatric: Oriented to person, place and time. Demonstrates good judgement and reason without abnormal affect or behaviors.  RELEVANT LABS AND IMAGING: CBC    Component Value Date/Time   WBC 7.0 04/18/2022 1201   RBC 4.86 04/18/2022 1201   HGB 12.6 04/18/2022 1201   HCT 38.5 04/18/2022 1201   PLT 392 04/18/2022 1201   MCV 79.2 (L) 04/18/2022 1201   MCH 25.9 (L) 04/18/2022  1201   MCHC 32.7 04/18/2022 1201   RDW 12.9 04/18/2022 1201   LYMPHSABS 2.2 01/28/2014 1453   MONOABS 0.5 01/28/2014 1453   EOSABS 0.2 01/28/2014 1453   BASOSABS 0.0 01/28/2014 1453    CMP     Component Value Date/Time   NA 139 04/18/2022 1201   K 4.0 04/18/2022 1201   CL 107 04/18/2022 1201   CO2 26 04/18/2022 1201   GLUCOSE 81 04/18/2022 1201   BUN 11 04/18/2022 1201   CREATININE 0.76 04/18/2022 1201   CALCIUM 9.0 04/18/2022 1201   GFRNONAA >  60 04/18/2022 1201    Assessment: 1.  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  Again discussed diagnosis with the patient.  She is at increased risk for rectal cancer given this finding in the past.  At this point recommend we repeat flex sig to get new biopsies and see what the state of her disease is.  This will help [place] to convince her to take her medications or see if they are truly necessary chronically. 2.  Flex sig was scheduled in the [place] with Dr. Silverio Decamp.  Did provide the patient a detailed list of risks for the procedure and she agrees to proceed. Patient is appropriate for endoscopic procedure(s) in the ambulatory ([place]) setting.  3.  Chart sent to Dr. Silverio Decamp in lieu of Dr. Ardis Hughs absence.  Ellouise Newer, PA-C [facility] 09/10/2022, 9:46 AM  Cc: Nani Gasser, MD

## 2022-09-10 NOTE — Telephone Encounter (Signed)
   Kim Alvarez [birth year] [id number]  Dear Dr Carin Primrose:  We have scheduled the above named patient for a(n) Flexible Sigmoidoscopy procedure. Our records show that (s)he is on anticoagulation therapy.  Patient reported she stop taking Xarelto in November 2023 she stated she felt better.We still need to know if we can have cardiac clearance.  Please advise as to whether the patient may come off their therapy of  Xarelto 2 days prior to their procedure which is scheduled for 10/15/22.  Please route your response to [employer] or fax response to [phone].  Sincerely,    [facility]

## 2022-09-16 NOTE — Telephone Encounter (Signed)
Advise patient per Dr Carin Primrose patient course for Xarelto was scheduled to end at the end of this month, before her procedure in March. Patient verbalized understanding.

## 2022-10-14 ENCOUNTER — Telehealth: Payer: Self-pay | Admitting: Physician Assistant

## 2022-10-15 ENCOUNTER — Telehealth: Payer: Self-pay | Admitting: Gastroenterology

## 2022-10-15 ENCOUNTER — Ambulatory Visit (AMBULATORY_SURGERY_CENTER): Payer: BC Managed Care – PPO | Admitting: Gastroenterology

## 2022-10-15 ENCOUNTER — Encounter: Payer: Self-pay | Admitting: Gastroenterology

## 2022-10-15 VITALS — BP 106/73 | HR 73 | Temp 98.2°F | Resp 18 | Wt 174.0 lb

## 2022-10-15 DIAGNOSIS — K51214 Ulcerative (chronic) proctitis with abscess: Secondary | ICD-10-CM | POA: Diagnosis not present

## 2022-10-15 DIAGNOSIS — K5140 Inflammatory polyps of colon without complications: Secondary | ICD-10-CM | POA: Diagnosis not present

## 2022-10-15 DIAGNOSIS — K51219 Ulcerative (chronic) proctitis with unspecified complications: Secondary | ICD-10-CM

## 2022-10-15 MED ORDER — PREDNISONE 5 MG PO TABS: ORAL_TABLET | ORAL | 0 refills | Status: AC

## 2022-10-15 MED ORDER — SODIUM CHLORIDE 0.9 % IV SOLN: 500.0000 mL | Freq: Once | INTRAVENOUS | Status: DC

## 2022-10-15 MED ORDER — MESALAMINE ER 0.375 G PO CP24: ORAL_CAPSULE | ORAL | 11 refills | Status: DC

## 2022-10-15 NOTE — Progress Notes (Signed)
[facility] History and Physical   Primary Care Physician:  Nani Gasser, MD   Reason for Procedure:  Rectal bleeding, ulcerative proctitis  Plan:    Flexible sigmoidoscopy with possible interventions as needed     HPI: Kim Alvarez is a very pleasant 37 y.o. female here for flex sig for evaluation of rectal bleeding, has h/o ulcerative proctitis obtain biopsies to assess disease activity.   The risks and benefits as well as alternatives of endoscopic procedure(s) have been discussed and reviewed. All questions answered. The patient agrees to proceed.    Past Medical History:  Diagnosis Date   Abnormal Pap smear 2007   Allergy    Chlamydia 2013   treated   Family history of sickle cell anemia (Hgb SS) in mother    FHx: cancer    H/O varicella    IUD (intrauterine device) in place 08/2010   Routine adult health maintenance 04/24/2022   Sickle cell trait ([place])    Yeast infection     Past Surgical History:  Procedure Laterality Date   NO PAST SURGERIES     PULMONARY EMBOLISM SURGERY     WISDOM TOOTH EXTRACTION Bilateral     Prior to Admission medications   Not on File    No current outpatient medications on file.   Current Facility-Administered Medications  Medication Dose Route Frequency Provider Last Rate Last Admin   0.9 %  sodium chloride infusion  500 mL Intravenous Once Mauri Pole, MD        Allergies as of 10/15/2022   (No Known Allergies)    Family History  Problem Relation Age of Onset   Sickle cell anemia Mother        alive   Colon cancer Father    Sickle cell anemia Maternal Aunt        alive   Sickle cell anemia Maternal Aunt        died   Sickle cell anemia Maternal Uncle        alive   Sickle cell anemia Maternal Uncle        died   Breast cancer Paternal Aunt    Esophageal cancer Neg Hx    Pancreatic cancer Neg Hx    Stomach cancer Neg Hx    Liver disease Neg Hx     Social History   Socioeconomic  History   Marital status: Married    Spouse name: Not on file   Number of children: Not on file   Years of education: Not on file   Highest education level: Not on file  Occupational History   Not on file  Tobacco Use   Smoking status: Never    Passive exposure: Never   Smokeless tobacco: Never  Vaping Use   Vaping Use: Never used  Substance and Sexual Activity   Alcohol use: Yes    Alcohol/week: 0.0 standard drinks of alcohol    Comment: socially    Drug use: No   Sexual activity: Yes    Partners: Male    Birth control/protection: None    Comment: last IC- this morning   Other Topics Concern   Not on file  Social History Narrative   Not on file   Social Determinants of Health   Financial Resource Strain: Not on file  Food Insecurity: Not on file  Transportation Needs: Not on file  Physical Activity: Not on file  Stress: Not on file  Social Connections: Not on file  Intimate Partner  Violence: Not on file    Review of Systems:  All other review of systems negative except as mentioned in the HPI.  Physical Exam: Vital signs in last 24 hours: Blood Pressure 111/74   Pulse 79   Temperature 98.2 F (36.8 C)   Weight 174 lb (78.9 kg)   Last Menstrual Period 09/23/2022 (Approximate)   Oxygen Saturation 100%   Body Mass Index 29.87 kg/m  General:   Alert, NAD Lungs:  Clear .   Heart:  Regular rate and rhythm Abdomen:  Soft, nontender and nondistended. Neuro/Psych:  Alert and cooperative. Normal mood and affect. A and O x 3  Reviewed labs, radiology imaging, old records and pertinent past GI work up  Patient is appropriate for planned procedure(s) and anesthesia in an ambulatory setting   K. Denzil Magnuson , MD [phone]

## 2022-10-15 NOTE — Telephone Encounter (Signed)
MD please advise

## 2022-10-15 NOTE — Patient Instructions (Addendum)
HANDOUTS PROVIDED ON: HEMORRHOIDS  The biopsies taken today have been sent for pathology.  The results can take 1-3 weeks to receive.  When your next colonoscopy should occur will be based on the pathology results.    You may resume your previous diet and medication schedule.  Use Benefiber 2 tsp by mouth twice daily.  This can be purchased over the counter.  2 prescriptions have been sent to your pharmacy today.  Apriso 0.375 g (4 capsules) daily by mouth every morning.  Prednisone 5 mg tablets (Take 2 tablets daily for 7 days then take 1 tablet daily for the next 7 days then stop)  I have scheduled an follow up appointment Darrell Jewel, PA on 11/29/22 @ 10:00 am  Thank you for allowing [place] to care for you today!!!   YOU HAD AN ENDOSCOPIC PROCEDURE TODAY AT [facility]:   Refer to the procedure report that was given to you for any specific questions about what was found during the examination.  If the procedure report does not answer your questions, please call your gastroenterologist to clarify.  If you requested that your care partner not be given the details of your procedure findings, then the procedure report has been included in a sealed envelope for you to review at your convenience later.  YOU SHOULD EXPECT: Some feelings of bloating in the abdomen. Passage of more gas than usual.  Walking can help get rid of the air that was put into your GI tract during the procedure and reduce the bloating. If you had a lower endoscopy (such as a colonoscopy or flexible sigmoidoscopy) you may notice spotting of blood in your stool or on the toilet paper. If you underwent a bowel prep for your procedure, you may not have a normal bowel movement for a few days.  Please Note:  You might notice some irritation and congestion in your nose or some drainage.  This is from the oxygen used during your procedure.  There is no need for concern and it should clear up in a day or so.  SYMPTOMS TO  REPORT IMMEDIATELY:  Following lower endoscopy (colonoscopy or flexible sigmoidoscopy):  Excessive amounts of blood in the stool  Significant tenderness or worsening of abdominal pains  Swelling of the abdomen that is new, acute  Fever of 100F or higher  For urgent or emergent issues, a gastroenterologist can be reached at any hour by calling [phone]. Do not use MyChart messaging for urgent concerns.    DIET:  We do recommend a small meal at first, but then you may proceed to your regular diet.  Drink plenty of fluids but you should avoid alcoholic beverages for 24 hours.  ACTIVITY:  You should plan to take it easy for the rest of today and you should NOT DRIVE or use heavy machinery until tomorrow (because of the sedation medicines used during the test).    FOLLOW UP: Our staff will call the number listed on your records the next business day following your procedure.  We will call around 7:15- 8:00 am to check on you and address any questions or concerns that you may have regarding the information given to you following your procedure. If we do not reach you, we will leave a message.     If any biopsies were taken you will be contacted by phone or by letter within the next 1-3 weeks.  Please call us at [phone] if you have not heard about  the biopsies in 3 weeks.    SIGNATURES/CONFIDENTIALITY: You and/or your care partner have signed paperwork which will be entered into your electronic medical record.  These signatures attest to the fact that that the information above on your After Visit Summary has been reviewed and is understood.  Full responsibility of the confidentiality of this discharge information lies with you and/or your care-partner.

## 2022-10-15 NOTE — Op Note (Signed)
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 The patient's tolerance of                            previous anesthesia was also reviewed. The risks                            and benefits of the procedure and the sedation                            options and risks were discussed with the patient.                            All questions were answered, and informed consent                            was obtained. Prior Anticoagulants: The patient has                            taken no anticoagulant or antiplatelet agents. ASA                            Grade Assessment: II - A patient with mild systemic                            disease. After reviewing the risks and benefits,                            the patient was deemed in satisfactory condition to  undergo the procedure.                           After obtaining informed consent, the scope was                            passed under direct vision. The Olympus [id number]-[id number]                            [id number]) Colonoscope was introduced through the                            anus and advanced to the the splenic flexure. The                             flexible sigmoidoscopy was accomplished without                            difficulty. The patient tolerated the procedure                            well. The flexible sigmoidoscopy was accomplished                            without difficulty. The patient tolerated the                            procedure well. The quality of the bowel                            preparation was good. Scope In: 11:31:59 AM Scope Out: 11:40:00 AM Total Procedure Duration: 0 hours 8 minutes 1 second  Findings:                 The perianal and digital rectal examinations were                            normal.                           Inflammation was found in a continuous and                            circumferential pattern from the anus to the rectum                            and as patches surrounded by normal mucosa in the                            sigmoid colon and in the descending colon. This was                            graded as Mayo Score 1& 2 (moderate, with marked  erythema, absent vascular pattern, friability,                            erosions). Biopsies were taken with a cold forceps                            for histology.                           Non-bleeding external and internal hemorrhoids were                            found during retroflexion. The hemorrhoids were                            small. Complications:            No immediate complications. Estimated Blood Loss:     Estimated blood loss was minimal. Impression:               - Moderately active (Mayo Score 2) left-sided                            ulcerative colitis. Biopsied.                           - Non-bleeding external and internal hemorrhoids. Recommendation:           - Patient has a contact number available for                            emergencies. The signs and symptoms of potential                            delayed complications were discussed with the                             patient. Return to normal activities tomorrow.                            Written discharge instructions were provided to the                            patient.                           - Resume previous diet.                           - Continue present medications.                           - Use Benefiber two teaspoons PO BID.                           - Use Apriso 0.375 g 4 caps PO QAM. Rx for 30 days  with 11 refills                           - Prednisone '10mg'$  daily X 7 days followed by '5mg'$                             daily X 7 days and stop                           - Return to GI office at the next available                            appointment with APP or Dr.Sarabella Caprio. Mauri Pole, MD 10/15/2022 11:48:47 AM This report has been signed electronically.

## 2022-10-15 NOTE — Progress Notes (Unsigned)
Called to room to assist during endoscopic procedure.  Patient ID and intended procedure confirmed with present staff. Received instructions for my participation in the procedure from the performing physician.  

## 2022-10-15 NOTE — Progress Notes (Signed)
Vss nad trans to pacu °

## 2022-10-15 NOTE — Telephone Encounter (Signed)
Patient called states she was recommended to start Mesalamine but the pharmacy told her it would be $300.00. She said that is too much money for her.

## 2022-10-16 ENCOUNTER — Other Ambulatory Visit: Payer: Self-pay

## 2022-10-16 ENCOUNTER — Telehealth: Payer: Self-pay

## 2022-10-16 MED ORDER — SULFASALAZINE 500 MG PO TABS: 1000.0000 mg | ORAL_TABLET | Freq: Two times a day (BID) | ORAL | 6 refills | Status: DC

## 2022-10-16 MED ORDER — FOLIC ACID 1 MG PO TABS: 1.0000 mg | ORAL_TABLET | Freq: Every day | ORAL | 0 refills | Status: DC

## 2022-10-16 NOTE — Telephone Encounter (Signed)
  Follow up Call-     10/15/2022   10:42 AM 04/06/2021    1:12 PM  Call back number  Post procedure Call Back phone  # [phone]  Permission to leave phone message Yes Yes    Post op call attempted, no answer, left WM.

## 2022-10-16 NOTE — Telephone Encounter (Signed)
Spoke with the patient. Advised of the plan of care. Patient agrees to this plan. Follow up appointment with Ellouise Newer, PA 11/29/22 already scheduled. Patient will call back with any further concerns.

## 2022-10-16 NOTE — Telephone Encounter (Signed)
Kim Alvarez, can you please send prescription for sulfasalazine 1 g twice daily along with folic acid 1 mg daily for 90 days and she needs follow-up office visit next available appointment either with me or APP.  Thank you

## 2022-10-29 ENCOUNTER — Telehealth: Payer: Self-pay | Admitting: *Deleted

## 2022-10-29 NOTE — Telephone Encounter (Signed)
ppointment Request From: [name]   With Provider: Nani Gasser, MD [facility]]   Preferred Date Range: 10/27/2022 - 11/02/2022   Preferred Times: Any Time   Reason for visit: Office Visit   Comments: I've been feeling a tightness in my upper chest on the right side for about a week now. I've been diagnosed with a pulmonary embolism.     Called pt - stated she was dx with a PE in Sept 2023. Requesting an appt. She's no longer on Xarelto - she took the medication as prescribed (finished in Feb). C/o intermittent tightness right-side of chest. Denies SHOB and dizziness.  Appt schedule with Dr Jodi Mourning on Raynelle Dick 3/21. Pt informed to go to ER if her symptoms worsen - stated she understands.

## 2022-10-31 ENCOUNTER — Ambulatory Visit (HOSPITAL_COMMUNITY)
Admission: RE | Admit: 2022-10-31 | Discharge: 2022-10-31 | Disposition: A | Discharge status: Home / Self Care | Payer: BC Managed Care – PPO | Source: Ambulatory Visit | Attending: Internal Medicine | Admitting: Internal Medicine

## 2022-10-31 ENCOUNTER — Ambulatory Visit (INDEPENDENT_AMBULATORY_CARE_PROVIDER_SITE_OTHER): Payer: BC Managed Care – PPO | Admitting: Student

## 2022-10-31 ENCOUNTER — Encounter: Payer: Self-pay | Admitting: Student

## 2022-10-31 VITALS — BP 107/88 | HR 130 | Temp 97.8°F | Ht 64.0 in | Wt 173.2 lb

## 2022-10-31 DIAGNOSIS — R079 Chest pain, unspecified: Secondary | ICD-10-CM | POA: Insufficient documentation

## 2022-10-31 LAB — BASIC METABOLIC PANEL
Anion gap: 8 (ref 5–15)
BUN: 6 mg/dL (ref 6–20)
CO2: 25 mmol/L (ref 22–32)
Calcium: 9.3 mg/dL (ref 8.9–10.3)
Chloride: 104 mmol/L (ref 98–111)
Creatinine, Ser: 0.67 mg/dL (ref 0.44–1.00)
GFR, Estimated: >60 mL/min (ref >60)
Glucose, Bld: 93 mg/dL (ref 70–99)
Potassium: 3.7 mmol/L (ref 3.5–5.1)
Sodium: 137 mmol/L (ref 135–145)

## 2022-10-31 LAB — D-DIMER, QUANTITATIVE: D-Dimer, Quant: 0.31 ug/mL-FEU (ref 0.00–0.50)

## 2022-10-31 NOTE — Progress Notes (Addendum)
   CC: Chest Tightness and Pain  HPI:   Ms.Kim Alvarez is a 37 y.o. female with a past medical history of PE and UC who presents with chest tightness and pain.    Past Medical History:  Diagnosis Date   Abnormal Pap smear 2007   Allergy    Chlamydia 2013   treated   Family history of sickle cell anemia (Hgb SS) in mother    FHx: cancer    H/O varicella    IUD (intrauterine device) in place 08/2010   Routine adult health maintenance 04/24/2022   Sickle cell trait (HCC)    Yeast infection      Review of Systems:    Reports chest tightness and pain, arm numbness Denies breath shortness, cough, hematemesis, exertional dyspnea, palpitations, diaphoresis, leg swelling or erythema   Physical Exam:  Vitals:   10/31/22 1536  BP: 107/88  Pulse: (!) 130  Temp: 97.8 F (36.6 C)  TempSrc: Oral  SpO2: 97%  Weight: 173 lb 3.2 oz (78.6 kg)  Height: 5\' 4"  (1.626 m)    General:   awake and alert, sitting comfortably in chair, cooperative, not in acute distress Skin:   warm and dry, intact without any obvious lesions or scars, no rashes Lungs:   normal respiratory effort, breathing unlabored, symmetrical chest rise, no crackles or wheezing Cardiac:   regular rate and rhythm, normal S1 and S2, no lower extremity swelling or erythema Musculoskeletal:   minimal tenderness to palpation over proximal calcaneal tendon bilaterally Neurologic:   oriented to person-place-time, moving all extremities, no gross focal deficits Psychiatric:   euthymic mood with congruent affect, intelligible speech    Assessment & Plan:   Chest pain Patient was found to have PE on 04-11-2022, prescribed rivaroxaban and symptoms improved two months into therapy. She also stopped birth control at that time. Sometime in 07-2022, she discontinued rivaroxaban prematurely about one month before it was scheduled to end. About 1-2 weeks ago, she developed chest tightness and pain very similar to when she was  diagnosed with the PE. Pain is intermittent, occurring in 5-10min episodes that occur maybe 2-3 times per day. Worsened with leaning forward and lying down. Describes associated right arm numbness starting in shoulder and radiating into hand that occurs less frequently. Denies breath shortness, cough, hematemesis, exertional dyspnea, palpitations, diaphoresis, and leg swelling or erythema. History notable for a an eight hour car ride on 3-10. Exam notable only for borderline tachycardia, HR 98, negative for lower extremity swelling-tenderness and lungs clear to auscultation bilaterally. Breathing comfortably on room air. Given symptom profile, history of PE, slightly elevated HR, and history of immobility, patient is moderate-high risk for PE. Diagnostic imaging is warranted to exclude this possibility. Electrocardiogram negative for ischemia, low concern for cardiac etiology.  - Take rivaroxaban dose today and until PE ruled out - Check BMP - Check D-dimer - Chest CT angiogram tomorrow 3-22 morning      See Encounters Tab for problem based charting.  Patient seen with Dr. Evette Doffing

## 2022-10-31 NOTE — Assessment & Plan Note (Addendum)
Patient was found to have PE on 04-11-2022, prescribed rivaroxaban and symptoms improved two months into therapy. She also stopped birth control at that time. Sometime in 07-2022, she discontinued rivaroxaban prematurely about one month before it was scheduled to end. About 1-2 weeks ago, she developed chest tightness and pain very similar to when she was diagnosed with the PE. Pain is intermittent, occurring in 5-10min episodes that occur maybe 2-3 times per day. Worsened with leaning forward and lying down. Describes associated right arm numbness starting in shoulder and radiating into hand that occurs less frequently. Denies breath shortness, cough, hematemesis, exertional dyspnea, palpitations, diaphoresis, and leg swelling or erythema. History notable for a an eight hour car ride on 3-10. Exam notable only for borderline tachycardia, HR 98, negative for lower extremity swelling-tenderness and lungs clear to auscultation bilaterally. Breathing comfortably on room air. Given symptom profile, history of PE, slightly elevated HR, and history of immobility, patient is moderate-high risk for PE. Diagnostic imaging is warranted to exclude this possibility. Electrocardiogram negative for ischemia, low concern for cardiac etiology.  - Take rivaroxaban dose today and until PE ruled out - Check BMP - Check D-dimer - Chest CT angiogram tomorrow 3-22 morning

## 2022-10-31 NOTE — Patient Instructions (Signed)
  Thank you, Ms.[name], for allowing [place] to provide your care today. Today we discussed . . .  > Chest tightness        - we are ordering some tests to determine whether you have another blood clot in your lungs       - please return to the hospital tomorrow so that we can perform a CT scan of your chest and check for a blood clot       - also, please take a dose of your rivaroxaban today and continue until we have ruled out a blood clot    I have ordered the following labs for you:   Lab Orders         BMP8+Anion Gap         D-dimer, quantitative (not at [facility])       Tests ordered today:  CT angiogram    Referrals ordered today:   Referral Orders  No referral(s) requested today      I have ordered the following medication/changed the following medications:   Stop the following medications: There are no discontinued medications.   Start the following medications: No orders of the defined types were placed in this encounter.     Follow up: One month   Remember:  Please return to the hospital tomorrow for your CT scan. We will call you with the results and start a medication if needed. Otherwise, we will see you again in about one month!   Should you have any questions or concerns please call the internal medicine clinic at [phone].     Roswell Nickel, MD [facility]

## 2022-11-01 ENCOUNTER — Ambulatory Visit (HOSPITAL_BASED_OUTPATIENT_CLINIC_OR_DEPARTMENT_OTHER)
Admission: RE | Admit: 2022-11-01 | Discharge: 2022-11-01 | Disposition: A | Discharge status: Home / Self Care | Payer: BC Managed Care – PPO | Source: Ambulatory Visit | Attending: Student in an Organized Health Care Education/Training Program | Admitting: Student in an Organized Health Care Education/Training Program

## 2022-11-01 DIAGNOSIS — R079 Chest pain, unspecified: Secondary | ICD-10-CM | POA: Insufficient documentation

## 2022-11-01 MED ORDER — IOHEXOL 350 MG/ML SOLN
75.0000 mL | Freq: Once | INTRAVENOUS | Status: AC | PRN
  Administered 2022-11-01: 75 mL via INTRAVENOUS

## 2022-11-01 NOTE — Progress Notes (Signed)
Internal Medicine Clinic Attending  I saw and evaluated the patient.  I personally confirmed the key portions of the history and exam documented by Dr. Jodi Mourning and I reviewed pertinent patient test results.  The assessment, diagnosis, and plan were formulated together and I agree with the documentation in the resident's note.   Young person living with IBD here with intermittent chest pressure radiating to the left shoulder. She has a history of a small PE in August likely provoked by untreated IBD. Her UC is now well treated in sulfasalazine. D dimer is negative which is reassuring. I do still want to fully rule out PE with a repeat CT chest. She is otherwise clinically stable and ok for outpatient evaluation of this.

## 2022-11-29 ENCOUNTER — Ambulatory Visit (INDEPENDENT_AMBULATORY_CARE_PROVIDER_SITE_OTHER): Payer: BC Managed Care – PPO | Admitting: Physician Assistant

## 2022-11-29 ENCOUNTER — Encounter: Payer: Self-pay | Admitting: Physician Assistant

## 2022-11-29 VITALS — BP 130/80 | HR 78 | Ht 64.0 in | Wt 172.0 lb

## 2022-11-29 DIAGNOSIS — K5100 Ulcerative (chronic) pancolitis without complications: Secondary | ICD-10-CM

## 2022-11-29 DIAGNOSIS — K5190 Ulcerative colitis, unspecified, without complications: Secondary | ICD-10-CM | POA: Diagnosis not present

## 2022-11-29 NOTE — Patient Instructions (Signed)
_______________________________________________________  If your blood pressure at your visit was 140/90 or greater, please contact your primary care physician to follow up on this.  If you are age 36 or younger, your body mass index should be between 19-25. Your Body mass index is 29.52 kg/m. If this is out of the aformentioned range listed, please consider follow up with your Primary Care Provider.  ________________________________________________________  The [facility] providers would like to encourage you to use [facility] to communicate with providers for non-urgent requests or questions.  Due to long hold times on the telephone, sending your provider a message by [facility] may be a faster and more efficient way to get a response.  Please allow 48 business hours for a response.  Please remember that this is for non-urgent requests.  _______________________________________________________  Kim Alvarez will follow up in our office on an as needed basis.  Thank you for entrusting me with your care and choosing [facility]  Hyacinth Meeker, PA-C

## 2022-11-29 NOTE — Progress Notes (Signed)
Chief Complaint: Follow-up ulcerative proctitis  HPI:    Kim Alvarez is a 37 year old African-American female with a past medical history as listed below including ulcerative proctitis, previously assigned to Dr. Christella Hartigan and recently note sent to Dr. 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    03/02/2021 office visit with me to discuss change in bowel habits to watery and red with an increase in generalized abdominal soreness.  Also some "neck soreness and tightness" and occasional trouble swallowing.  She had vague history of possible colon cancer in her father and was unsure about the rest.  At that time discussed GI pathogen panel and then possibly EGD and colonoscopy.  Gave her a trial of Dicyclomine.    04/06/2021 EGD with mild nonspecific gastritis and otherwise normal.  Biopsy showed mild chronic gastritis.  Colonoscopy with mild to moderate distal colitis (proctitis) and otherwise normal.  Biopsy showed mildly active chronic colitis consistent with IBD.  Patient started on Rowasa enemas 1 enema nightly.    04/18/2022 CBC with MCV minimally decreased at 79.2 and otherwise normal.  BMP normal.    04/24/2022 office visit with internal medicine and discussed pulmonary embolism.  She is on Xarelto.  Also discussed ulcerative colitis.  Discussed fecal urgency.    07/24/2022 patient seen in clinic and told me she does not have any further rectal bleeding for which she was seen initially but did have occasional diarrhea and occasional abdominal pain.  Discussed diagnosis of ulcerative proctitis and we prescribed Rowasa enemas nightly for the next 2 to 3 months to see if this changes any of her symptoms.  Explained that if she did not feel any different then she would likely need repeat flex sig/colonoscopy to evaluate state of her symptoms.    09/10/2022 patient tells me she had only done 3 of the Rowasa enemas in total because they were uncomfortable, she had no further  bleeding.  Occasional sharp pains in her abdomen.  At that point again discussed her diagnosis.  Recommended a flex sig to get new biopsies and see what the state of her disease was.    10/15/2022 flex sigmoidoscopy with Dr. Lavon Paganini with moderately active left-sided ulcerative colitis and nonbleeding external and internal hemorrhoids.  Patient started on Apriso 0.375 g 4 caps p.o. every morning.  Also told to use Benefiber 2 teaspoons p.o. twice daily.Also given Prednisone taper starting at 10 mg daily for 7 days followed by 5 mg daily for 7 days and then stop.  Biopsies were consistent with ulcerative colitis in the left colon.  Repeat colonoscopy recommended in a year.  Discussed that if patient had persistent symptoms then she would benefit from starting biologic such as Velsipity 2 mg daily.    Today, patient to clinic and tells me that when she is using her Sulfasalazine 2 tablets twice daily as well as Folic acid 1 mg a day and Benefiber when she can remember.  She thinks all this has helped her bowel movements.  She has a solid bowel movement about every other day with no blood no abdominal pain.  Tells me her partner is keeping her on track with keeping her medications and staying on things because she now realizes how much she needs them.    Does express that she is going through a period in her life where she is very stressed and anxious and having trouble focusing.    Denies fever, chills, weight loss, blood in her stool, nausea  or vomiting.  Past Medical History:  Diagnosis Date   Abnormal Pap smear 2007   Allergy    Chlamydia 2013   treated   Family history of sickle cell anemia (Hgb SS) in mother    FHx: cancer    H/O varicella    IUD (intrauterine device) in place 08/2010   Routine adult health maintenance 04/24/2022   Sickle cell trait (HCC)    Yeast infection     Past Surgical History:  Procedure Laterality Date   NO PAST SURGERIES     PULMONARY EMBOLISM SURGERY     WISDOM  TOOTH EXTRACTION Bilateral     Current Outpatient Medications  Medication Sig Dispense Refill   folic acid (FOLVITE) 1 MG tablet Take 1 tablet (1 mg total) by mouth daily. 90 tablet 0   sulfaSALAzine (AZULFIDINE) 500 MG tablet Take 2 tablets (1,000 mg total) by mouth 2 (two) times daily. With folic acid 1 mg daily 120 tablet 6   No current facility-administered medications for this visit.    Allergies as of 11/29/2022   (No Known Allergies)    Family History  Problem Relation Age of Onset   Sickle cell anemia Mother        alive   Colon cancer Father    Sickle cell anemia Maternal Aunt        alive   Sickle cell anemia Maternal Aunt        died   Sickle cell anemia Maternal Uncle        alive   Sickle cell anemia Maternal Uncle        died   Breast cancer Paternal Aunt    Esophageal cancer Neg Hx    Pancreatic cancer Neg Hx    Stomach cancer Neg Hx    Liver disease Neg Hx     Social History   Socioeconomic History   Marital status: Married    Spouse name: Not on file   Number of children: Not on file   Years of education: Not on file   Highest education level: Not on file  Occupational History   Not on file  Tobacco Use   Smoking status: Never    Passive exposure: Never   Smokeless tobacco: Never  Vaping Use   Vaping Use: Never used  Substance and Sexual Activity   Alcohol use: Yes    Alcohol/week: 0.0 standard drinks of alcohol    Comment: socially    Drug use: No   Sexual activity: Yes    Partners: Male    Birth control/protection: None    Comment: last IC- this morning   Other Topics Concern   Not on file  Social History Narrative   Not on file   Social Determinants of Health   Financial Resource Strain: Not on file  Food Insecurity: Not on file  Transportation Needs: Not on file  Physical Activity: Not on file  Stress: Not on file  Social Connections: Not on file  Intimate Partner Violence: Not on file    Review of Systems:     Constitutional: No weight loss, fever or chills Cardiovascular: No chest pain Respiratory: No SOB  Gastrointestinal: See HPI and otherwise negative   Physical Exam:  Vital signs: BP 130/80   Pulse 78   Ht  (1.626 m)   Wt 172 lb (78 kg)   BMI 29.52 kg/m    Constitutional:   Pleasant AA female appears to be in NAD, Well developed, Well nourished,  alert and cooperative Respiratory: Respirations even and unlabored. Lungs clear to auscultation bilaterally.   No wheezes, crackles, or rhonchi.  Cardiovascular: Normal S1, S2. No MRG. Regular rate and rhythm. No peripheral edema, cyanosis or pallor.  Gastrointestinal:  Soft, nondistended, nontender. No rebound or guarding. Normal bowel sounds. No appreciable masses or hepatomegaly. Rectal:  Not performed.  Psychiatric: Oriented to person, place and time. Demonstrates good judgement and reason without abnormal affect or behaviors.  RELEVANT LABS AND IMAGING: CBC    Component Value Date/Time   WBC 7.0 04/18/2022 1201   RBC 4.86 04/18/2022 1201   HGB 12.6 04/18/2022 1201   HCT 38.5 04/18/2022 1201   PLT 392 04/18/2022 1201   MCV 79.2 (L) 04/18/2022 1201   MCH 25.9 (L) 04/18/2022 1201   MCHC 32.7 04/18/2022 1201   RDW 12.9 04/18/2022 1201   LYMPHSABS 2.2 01/28/2014 1453   MONOABS 0.5 01/28/2014 1453   EOSABS 0.2 01/28/2014 1453   BASOSABS 0.0 01/28/2014 1453    CMP     Component Value Date/Time   NA 137 10/31/2022 1626   K 3.7 10/31/2022 1626   CL 104 10/31/2022 1626   CO2 25 10/31/2022 1626   GLUCOSE 93 10/31/2022 1626   BUN 6 10/31/2022 1626   CREATININE 0.67 10/31/2022 1626   CALCIUM 9.3 10/31/2022 1626   GFRNONAA >60 10/31/2022 1626    Assessment: 1.  Ulcerative colitis: New diagnosis from flex sig 10/15/2022, being maintained on Sulfasalazine 1000 mg twice daily, Benefiber and Folic acid and doing well  Plan: 1.  Continue Sulfasalazine 1000 mg twice daily.  We discussed how important this medication is for  her. 2.  Continue Folic acid and Benefiber 3.  Discussed the diagnosis of ulcerative colitis. Answered questions. 4.  Recommend she discuss her anxiety and feelings with her PCP. 5.  Patient will follow in clinic in 6 months with Dr. Lavon Paganini or myself.  Hyacinth Meeker, PA-C [facility] 11/29/2022, 10:06 AM  Cc: Marrianne Mood, MD

## 2022-12-02 DIAGNOSIS — Z01419 Encounter for gynecological examination (general) (routine) without abnormal findings: Secondary | ICD-10-CM | POA: Diagnosis not present

## 2022-12-02 DIAGNOSIS — E049 Nontoxic goiter, unspecified: Secondary | ICD-10-CM | POA: Diagnosis not present

## 2022-12-02 DIAGNOSIS — N841 Polyp of cervix uteri: Secondary | ICD-10-CM | POA: Diagnosis not present

## 2022-12-09 ENCOUNTER — Encounter: Payer: Self-pay | Admitting: Student

## 2022-12-09 ENCOUNTER — Other Ambulatory Visit: Payer: Self-pay

## 2022-12-09 ENCOUNTER — Ambulatory Visit (INDEPENDENT_AMBULATORY_CARE_PROVIDER_SITE_OTHER): Payer: BC Managed Care – PPO | Admitting: Student

## 2022-12-09 VITALS — BP 109/61 | HR 101 | Temp 98.2°F | Ht 64.0 in | Wt 175.2 lb

## 2022-12-09 DIAGNOSIS — Z0000 Encounter for general adult medical examination without abnormal findings: Secondary | ICD-10-CM

## 2022-12-09 DIAGNOSIS — F320 Major depressive disorder, single episode, mild: Secondary | ICD-10-CM | POA: Diagnosis not present

## 2022-12-09 DIAGNOSIS — E042 Nontoxic multinodular goiter: Secondary | ICD-10-CM | POA: Diagnosis not present

## 2022-12-09 DIAGNOSIS — K51211 Ulcerative (chronic) proctitis with rectal bleeding: Secondary | ICD-10-CM | POA: Diagnosis not present

## 2022-12-09 MED ORDER — ESCITALOPRAM OXALATE 10 MG PO TABS: 10.0000 mg | ORAL_TABLET | Freq: Every day | ORAL | 3 refills | Status: DC

## 2022-12-09 NOTE — Progress Notes (Addendum)
    Subjective:  Kim Alvarez is a 37 y.o. who presents to clinic for the following:  Feelings of anxiety and depressed mood.  Anxiety and depressed mood have been ongoing since February.  She reports lots of events in her personal life that culminated around that time and significant external stress and pressure.  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  Her mother also fell ill around that time and is currently convalescing in a nursing home.  Not to mention her own complex medical conditions requiring lots of doctors visits and procedures.  Regarding her ulcerative colitis, she is currently well-controlled on mesalamine.  No new symptoms.  GI recommends colonoscopy in 1 year.  She sees a gynecologist who recently performed her Pap smear.  At the visit this gynecologist inquired as to her history of thyroid problems.  This person reports history of enlarged thyroid.  Review of systems notable for persistent tachycardia.  Objective:   Vitals:   12/09/22 1359  BP: 109/61  Pulse: (!) 101  Temp: 98.2 F (36.8 C)  TempSrc: Oral  SpO2: 99%  Weight: 175 lb 3.2 oz (79.5 kg)  Height: 5\' 4"  (1.626 m)    Physical Exam Well-appearing, but tearful Diffusely enlarged thyroid gland Heart rate is tachycardic, rhythm is regular, radial pulses are strong Breathing is regular and unlabored on room air Skin is warm and dry Alert and oriented Upset, anxious, concordant affect  Assessment & Plan:   Goiter, nontoxic, multinodular With reported history of thyromegaly and, indeed, thyromegaly on exam today, will check thyroid labs.  Other than tachycardia, she does not seem overtly hyperthyroid.  Doubt her anxiety is related to an underlying thyroid disorder.  Anticipate a thyroid ultrasound regardless of test results today. - TSH - Free T4 - T3  Addendum: Thyroid studies  WNL. Upon further review of this person's medical record, thyroid US from 2013 showed a multinodular thyroid goiter without any worrisome dominant solid nodule.  She had a normal nuclear medicine thyroid uptake scan which did not show any cold nodules.  I think to complete her workup for goiter at this point we should measure thyroid peroxidase antibodies, positivity would suggest Hashimoto's without hypothyroidism in this person. Non-urgent, won't change management as she is without clinical hypothyroidism. Can be ordered during next routine follow-up visit.  Routine adult health maintenance Pap smear done at gynecologist office.  Believe they are in our [place] network, this patient's THN/ACO patient.  Will be on the look out for those records.  Ulcerative colitis (HCC) Stable and well-controlled on mesalamine.  MDD (major depressive disorder), single episode, mild (HCC) History consistent with an episode of MDD.  Anxiety features are predominant in this person.  She does not have thoughts of self-harm or suicide.  Hopeful that this is a temporary problem in this person with lots of external stressors.  In the meantime we will refer to [facility] of integrated behavioral health and started trial of SSRI. - Escitalopram 10 mg daily    Return in 1 month, for TELEHEALTH visit for anxiety and mood follow-up.  Patient discussed with Dr. Michel Bickers MD 12/10/2022, 9:53 AM  [phone]

## 2022-12-09 NOTE — Assessment & Plan Note (Signed)
With reported history of thyromegaly and, indeed, thyromegaly on exam today, will check thyroid labs.  Other than tachycardia, she does not seem overtly hyperthyroid.  Doubt her anxiety is related to an underlying thyroid disorder.  Anticipate a thyroid ultrasound regardless of test results today. - TSH - Free T4 - T3  Addendum: Thyroid studies WNL. Upon further review of this person's medical record, thyroid US from 2013 showed a multinodular thyroid goiter without any worrisome dominant solid nodule.  She had a normal nuclear medicine thyroid uptake scan which did not show any cold nodules.  I think to complete her workup for goiter at this point we should measure thyroid peroxidase antibodies, positivity would suggest Hashimoto's without hypothyroidism in this person. Non-urgent, won't change management as she is without clinical hypothyroidism. Can be ordered during next routine follow-up visit.

## 2022-12-09 NOTE — Assessment & Plan Note (Addendum)
History consistent with an episode of MDD.  Anxiety features are predominant in this person.  She does not have thoughts of self-harm or suicide.  Hopeful that this is a temporary problem in this person with lots of external stressors.  In the meantime we will refer to [facility] of integrated behavioral health and started trial of SSRI. - Escitalopram 10 mg daily

## 2022-12-09 NOTE — Assessment & Plan Note (Signed)
Stable and well-controlled on mesalamine.

## 2022-12-09 NOTE — Patient Instructions (Signed)
This after visit summary is an important review of tests, referrals, and medication changes that were discussed during your visit. If you have questions or concerns, call [phone]. Outside of clinic business hours, call the main hospital at [phone] and ask the operator for the on-call internal medicine resident.   Ernesta Amble MD 12/09/2022, 2:59 PM

## 2022-12-09 NOTE — Assessment & Plan Note (Signed)
Pap smear done at gynecologist office.  Believe they are in our [place] network, this patient's THN/ACO patient.  Will be on the look out for those records.

## 2022-12-10 LAB — T4, FREE: Free T4: 1.49 ng/dL (ref 0.82–1.77)

## 2022-12-10 LAB — TSH: TSH: 0.695 u[IU]/mL (ref 0.450–4.500)

## 2022-12-10 LAB — T3: T3, Total: 105 ng/dL (ref 71–180)

## 2022-12-10 NOTE — Progress Notes (Signed)
Thyroid studies WNL. Thyroid US from 2013 showed a multinodular thyroid goiter without any worrisome dominant solid nodule.  She had a normal nuclear medicine thyroid uptake scan which did not show any cold nodules.  I think to complete her workup for goiter at this point we should measure thyroid peroxidase antibodies, positivity would suggest Hashimoto's without hypothyroidism in this person. Non-urgent, won't change management as she is without clinical hypothyroidism. Can be ordered during next routine follow-up visit. Called and discussed with patient.

## 2022-12-12 NOTE — Progress Notes (Signed)
Internal Medicine Clinic Attending  Case discussed with Dr. McLendon  At the time of the visit.  We reviewed the resident's history and exam and pertinent patient test results.  I agree with the assessment, diagnosis, and plan of care documented in the resident's note.  

## 2022-12-12 NOTE — Addendum Note (Signed)
Addended by: Derrek Monaco on: 12/12/2022 04:02 PM   Modules accepted: Level of Service

## 2023-01-07 ENCOUNTER — Ambulatory Visit (INDEPENDENT_AMBULATORY_CARE_PROVIDER_SITE_OTHER): Payer: Self-pay | Admitting: Licensed Clinical Social Worker

## 2023-01-07 DIAGNOSIS — F320 Major depressive disorder, single episode, mild: Secondary | ICD-10-CM

## 2023-01-07 NOTE — BH Specialist Note (Signed)
Integrated Behavioral Health via Telemedicine Visit  01/07/2023 Kim Alvarez [id number]  Number of Integrated Behavioral Health Clinician visits: 1- Initial Visit  Session Start time: 1000   Session End time: 1010  Total time in minutes: 10   Referring Provider: Miguel Aschoff Patient/Family location: Home [facility] Provider location: Office All persons participating in visit: Patient and [facility] Types of Service: Introduction only  I connected with [name] Telephone or Video Enabled Telemedicine Application  (Video is Caregility application) and verified that I am speaking with the correct person using two identifiers. Discussed confidentiality: Yes   I discussed the limitations of telemedicine and the availability of in person appointments.  Discussed there is a possibility of technology failure and discussed alternative modes of communication if that failure occurs.  I discussed that engaging in this telemedicine visit, they consent to the provision of behavioral healthcare and the services will be billed under their insurance.  Patient and/or legal guardian expressed understanding and consented to Telemedicine visit: Yes   Assessment: Patient discussed wanting a therapist. [name] introduces self and gave patient [name] contact information. [name] discussed types of visits and ability to reschedule appointments if needed. Patient requested a a video visit on June 10th.     I discussed the assessment and treatment plan with the patient and/or parent/guardian. They were provided an opportunity to ask questions and all were answered. They agreed with the plan and demonstrated an understanding of the instructions.   They were advised to call back or seek an in-person evaluation if the symptoms worsen or if the condition fails to improve as anticipated.  [name], MSW, LCSW-A She/Her Behavioral Health Clinician [facility]  Internal Medicine Center Direct Dial:[phone]   Fax [phone] Main Office Phone: [phone] [address] [place] [place], [place] [address] Website: [facility] Internal Medicine [facility]  [place], [place]  [place]

## 2023-01-20 ENCOUNTER — Ambulatory Visit (INDEPENDENT_AMBULATORY_CARE_PROVIDER_SITE_OTHER): Payer: BC Managed Care – PPO | Admitting: Licensed Clinical Social Worker

## 2023-01-20 DIAGNOSIS — F320 Major depressive disorder, single episode, mild: Secondary | ICD-10-CM

## 2023-01-20 NOTE — BH Specialist Note (Signed)
Integrated Behavioral Health via Telemedicine Visit  01/20/2023 REYANN TROOP [id number]  Number of Integrated Behavioral Health Clinician visits: 1- Initial Visit  Session Start time: 1430   Session End time: 1515  Total time in minutes: 45   Referring Provider: Marrianne Mood, MD Patient/Family location: Home [facility] Provider location: Office All persons participating in visit: Patient and [facility] Types of Service: Telephone visit and Introduction only  I connected with [name]  via  Telephone or Video Enabled Telemedicine Application  (Video is Caregility application) and verified that I am speaking with the correct person using two identifiers. Discussed confidentiality: Yes   I discussed the limitations of telemedicine and the availability of in person appointments.  Discussed there is a possibility of technology failure and discussed alternative modes of communication if that failure occurs.  I discussed that engaging in this telemedicine visit, they consent to the provision of behavioral healthcare and the services will be billed under their insurance.  Patient and/or legal guardian expressed understanding and consented to Telemedicine visit: Yes   Presenting Concerns: Patient and/or family reports the following symptoms/concerns: Stress Duration of problem: Less than 10 years; Severity of problem: moderate  Patient and/or Family's Strengths/Protective Factors: Sense of purpose  Goals Addressed: Patient will:  Reduce symptoms of: stress   Progress towards Goals: Ongoing  Interventions: Interventions utilized:  Mindfulness or Relaxation Training and Supportive Counseling Standardized Assessments completed: PHQ 9    12/11/2022    8:25 AM 12/11/2022    8:24 AM 10/31/2022    3:39 PM  PHQ-SADS Last 3 Score only  Total GAD-7 Score 15    PHQ Adolescent Score  16 0       Assessment: [facility] introduced self to patient on today. Provided patient understanding of [facility]  role.   Patient is married and a mother of four children.   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   Patient is scheduled to follow up with [facility] on July 9th at 9:30am via telephone.  Patient may benefit from ongoing therapy .  Plan: Follow up with behavioral health clinician on : July 9   I discussed the assessment and treatment plan with the patient and/or parent/guardian. They were provided an opportunity to ask questions and all were answered. They agreed with the plan and demonstrated an understanding of the instructions.   They were advised to call back or seek an in-person evaluation if the symptoms worsen or if the condition fails to improve as anticipated.  [name], MSW, LCSW-A She/Her Behavioral Health Clinician [facility]  Internal Medicine Center Direct Dial:[phone]  Fax [phone] Main Office Phone: [phone] [address] [place] [place], [place] [address] Website: [facility] Internal Medicine [facility]  [place], [place]  [place]

## 2023-02-18 ENCOUNTER — Ambulatory Visit (INDEPENDENT_AMBULATORY_CARE_PROVIDER_SITE_OTHER): Payer: BC Managed Care – PPO | Admitting: Licensed Clinical Social Worker

## 2023-02-18 DIAGNOSIS — F320 Major depressive disorder, single episode, mild: Secondary | ICD-10-CM

## 2023-02-18 NOTE — BH Specialist Note (Signed)
Integrated Behavioral Health via Telemedicine Visit  02/18/2023 Kim Alvarez [id number]  Number of Integrated Behavioral Health Clinician visits: 2- Second Visit  Session Start time: 0930   Session End time: 1030  Total time in minutes: 60   Referring Provider: Marrianne Mood, MD Patient/Family location: Home [facility] Provider location: Office All persons participating in visit: Patient and [facility] Types of Service: Telephone visit   I connected with Kim Alvarez  via  Telephone or Video Enabled Telemedicine Application  (Video is Caregility application) and verified that I am speaking with the correct person using two identifiers. Discussed confidentiality: Yes    I discussed the limitations of telemedicine and the availability of in person appointments.  Discussed there is a possibility of technology failure and discussed alternative modes of communication if that failure occurs.   I discussed that engaging in this telemedicine visit, they consent to the provision of behavioral healthcare and the services will be billed under their insurance.   Patient and/or legal guardian expressed understanding and consented to Telemedicine visit: Yes    Presenting Concerns: Patient and/or family reports the following symptoms/concerns: Stress with managing academics and family life. 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 [facility] and patient discussed organizing day and starting day at 7:30 am. Patient will utilize her office setting on campus to complete school work. Patient is meeting with her school advisor to discuss academics.   Patient is married with four children. 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   Patient has an upcoming cruise with family. Patient is excited about cruise to the [place]. Patient has enrolled children in swimming courses.   Patient applied for [employer] ( FNS benefits for school age children).  Patient and [facility] also discussed medicaid and FNS.  Patient was  motivated at the end of call. Patient agreed to continue meeting with [facility]  Duration of problem: Less than 10 years; Severity of problem: moderate   Patient and/or Family's Strengths/Protective Factors: Sense of purpose   Goals Addressed: Patient will:  Reduce symptoms of: stress    Progress towards Goals: Ongoing   Interventions: Interventions utilized:  Mindfulness or Relaxation Training and Supportive Counseling Standardized Assessments completed: PHQ 9     12/11/2022    8:25 AM 12/11/2022    8:24 AM 10/31/2022    3:39 PM  PHQ-SADS Last 3 Score only  Total GAD-7 Score 15      PHQ Adolescent Score   16 0          Assessment: Patient will benefit from stress management and Organizational help.   Plan: Follow up with behavioral health clinician on : August 6, telehealth at [facility]     I discussed the assessment and treatment plan with the patient and/or parent/guardian. They were provided an opportunity to ask questions and all were answered. They agreed with the plan and demonstrated an understanding of the instructions.   They were advised to call back or seek an in-person evaluation if the symptoms worsen or if the condition fails to improve as anticipated.   [name], MSW, LCSW-A She/Her Behavioral Health Clinician [facility]  Internal Medicine Center Direct Dial:[phone]  Fax [phone] Main Office Phone: [phone] [address] [place] [place], [place] [address] Website: [facility] Internal Medicine [facility]  [place], [place]  [place]

## 2023-03-18 ENCOUNTER — Ambulatory Visit (INDEPENDENT_AMBULATORY_CARE_PROVIDER_SITE_OTHER): Payer: BC Managed Care – PPO | Admitting: Licensed Clinical Social Worker

## 2023-03-18 DIAGNOSIS — F320 Major depressive disorder, single episode, mild: Secondary | ICD-10-CM | POA: Diagnosis not present

## 2023-03-18 NOTE — BH Specialist Note (Signed)
Integrated Behavioral Health via Telemedicine Visit  03/18/2023 Kim Alvarez [id number]  Number of Integrated Behavioral Health Clinician visits: 2- Second Visit  Session Start time: 0900   Session End time: 1000  Total time in minutes: 60   Referring Provider: Marrianne Mood, MD Patient/Family location: Home Kim Alvarez Provider location: Office All persons participating in visit: Patient and Kim Alvarez Types of Service: Telephone visit   I connected with Kim Alvarez  via  Telephone or Video Enabled Telemedicine Application  (Video is Caregility application) and verified that I am speaking with the correct person using two identifiers. Discussed confidentiality: Yes    I discussed the limitations of telemedicine and the availability of in person appointments.  Discussed there is a possibility of technology failure and discussed alternative modes of communication if that failure occurs.   I discussed that engaging in this telemedicine visit, they consent to the provision of behavioral healthcare and the services will be billed under their insurance.   Patient and/or legal guardian expressed understanding and consented to Telemedicine visit: Yes    Presenting Concerns: Patient and/or family reports the following symptoms/concerns: Patient continue to balance academics and family life. 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 Patient has been successful in organizing her day and beginning it earlier.    Patient is married with four children. 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    Patient has an upcoming cruise with family. Patient is excited about cruise to the [place].    Patient was motivated at the end of call. Patient agreed to continue meeting with Kim Alvarez.   Duration of problem: Less than 10 years; Severity of problem: moderate   Patient and/or Family's Strengths/Protective Factors: Sense of purpose   Goals Addressed: Patient will:  Reduce symptoms of: stress     Progress towards Goals: Ongoing   Interventions: Interventions utilized:  Mindfulness or Relaxation Training and Supportive Counseling Standardized Assessments completed: PHQ 9     12/11/2022    8:25 AM 12/11/2022    8:24 AM 10/31/2022    3:39 PM  PHQ-SADS Last 3 Score only  Total GAD-7 Score 15      PHQ Adolescent Score   16 0          Assessment: Patient will benefit from stress management and Organizational help.   Plan: Follow up with behavioral health clinician on : September 11, video visit at 2pm     I discussed the assessment and treatment plan with the patient and/or parent/guardian. They were provided an opportunity to ask questions and all were answered. They agreed with the plan and demonstrated an understanding of the instructions.   They were advised to call back or seek an in-person evaluation if the symptoms worsen or if the condition fails to improve as anticipated.   Kim Alvarez, MSW, LCSW-A She/Her Behavioral Health Clinician Kim Alvarez  Internal Medicine [facility] Direct Dial:[phone]  Fax [phone] Main Office Phone: [phone] [address] [place] [place], [place] [address] Website: [facility] Internal Medicine [facility]  [place], [place]  [place]

## 2023-03-28 ENCOUNTER — Encounter: Payer: Self-pay | Admitting: Student

## 2023-04-23 ENCOUNTER — Ambulatory Visit (INDEPENDENT_AMBULATORY_CARE_PROVIDER_SITE_OTHER): Payer: BC Managed Care – PPO | Admitting: Licensed Clinical Social Worker

## 2023-04-23 DIAGNOSIS — F320 Major depressive disorder, single episode, mild: Secondary | ICD-10-CM

## 2023-04-23 NOTE — BH Specialist Note (Signed)
[facility] was late to appointment due to previous appointment going over. [facility] contacted patient via telephone at 2:30 pm. Patient didn't answer. [facility] left a VM that [facility] will attempt patient again on 09/12 at 3:30 pm.  [name], MSW, LCSW-A She/Her Behavioral Health Clinician [facility]  Internal Medicine Center Direct [phone]  Fax [phone] Main Office Phone: [phone] [address] [place] [place], [place] [address] Website: [facility] Internal Medicine [facility]  [place], [place]  [place]

## 2023-05-28 ENCOUNTER — Ambulatory Visit: Payer: BC Managed Care – PPO | Admitting: Physician Assistant

## 2023-07-16 ENCOUNTER — Telehealth: Payer: Self-pay | Admitting: Physician Assistant

## 2023-07-16 ENCOUNTER — Other Ambulatory Visit: Payer: Self-pay | Admitting: Gastroenterology

## 2023-07-16 MED ORDER — SULFASALAZINE 500 MG PO TABS: 1000.0000 mg | ORAL_TABLET | Freq: Two times a day (BID) | ORAL | 0 refills | Status: DC

## 2023-07-16 NOTE — Telephone Encounter (Signed)
Rx for sulfasalazine sent to pharmacy as requested.

## 2023-07-16 NOTE — Telephone Encounter (Signed)
Inbound call from patient requesting a refill for sulfasalazine medication. Please advise, thank you.

## 2023-08-11 ENCOUNTER — Other Ambulatory Visit: Payer: Self-pay | Admitting: Physician Assistant

## 2023-09-23 ENCOUNTER — Telehealth: Payer: Self-pay | Admitting: Physician Assistant

## 2023-09-23 MED ORDER — SULFASALAZINE 500 MG PO TABS: 1000.0000 mg | ORAL_TABLET | Freq: Two times a day (BID) | ORAL | 0 refills | Status: DC

## 2023-09-23 NOTE — Telephone Encounter (Signed)
Sent script to pharmacy. Patient informed she will need an office visit for additional refills. Patient was at an appointment and will call back to schedule.

## 2023-09-23 NOTE — Telephone Encounter (Signed)
Patient called and stated that she is needing a refill on her sulfasalazine. Patient is requesting a call back once medication is sent to pharmacy. Please advise.

## 2023-09-26 DIAGNOSIS — Z113 Encounter for screening for infections with a predominantly sexual mode of transmission: Secondary | ICD-10-CM | POA: Diagnosis not present

## 2023-09-26 DIAGNOSIS — Z114 Encounter for screening for human immunodeficiency virus [HIV]: Secondary | ICD-10-CM | POA: Diagnosis not present

## 2023-09-26 DIAGNOSIS — Z1159 Encounter for screening for other viral diseases: Secondary | ICD-10-CM | POA: Diagnosis not present

## 2023-11-06 ENCOUNTER — Encounter: Payer: Self-pay | Admitting: Physician Assistant

## 2023-11-06 ENCOUNTER — Other Ambulatory Visit (INDEPENDENT_AMBULATORY_CARE_PROVIDER_SITE_OTHER)

## 2023-11-06 ENCOUNTER — Ambulatory Visit (INDEPENDENT_AMBULATORY_CARE_PROVIDER_SITE_OTHER): Payer: BC Managed Care – PPO | Admitting: Physician Assistant

## 2023-11-06 VITALS — BP 98/72 | HR 68 | Ht 64.0 in | Wt 165.0 lb

## 2023-11-06 DIAGNOSIS — K5900 Constipation, unspecified: Secondary | ICD-10-CM

## 2023-11-06 DIAGNOSIS — K5100 Ulcerative (chronic) pancolitis without complications: Secondary | ICD-10-CM

## 2023-11-06 LAB — CBC WITH DIFFERENTIAL/PLATELET
Basophils Absolute: 0 10*3/uL (ref 0.0–0.1)
Basophils Relative: 0.6 % (ref 0.0–3.0)
Eosinophils Absolute: 0.2 10*3/uL (ref 0.0–0.7)
Eosinophils Relative: 3.8 % (ref 0.0–5.0)
HCT: 40.6 % (ref 36.0–46.0)
Hemoglobin: 13 g/dL (ref 12.0–15.0)
Lymphocytes Relative: 31.4 % (ref 12.0–46.0)
Lymphs Abs: 1.7 10*3/uL (ref 0.7–4.0)
MCHC: 32.1 g/dL (ref 30.0–36.0)
MCV: 83.3 fl (ref 78.0–100.0)
Monocytes Absolute: 0.5 10*3/uL (ref 0.1–1.0)
Monocytes Relative: 8.6 % (ref 3.0–12.0)
Neutro Abs: 3 10*3/uL (ref 1.4–7.7)
Neutrophils Relative %: 55.6 % (ref 43.0–77.0)
Platelets: 322 10*3/uL (ref 150.0–400.0)
RBC: 4.87 Mil/uL (ref 3.87–5.11)
RDW: 13.5 % (ref 11.5–15.5)
WBC: 5.3 10*3/uL (ref 4.0–10.5)

## 2023-11-06 LAB — COMPREHENSIVE METABOLIC PANEL WITH GFR
ALT: 16 U/L (ref 0–35)
AST: 17 U/L (ref 0–37)
Albumin: 4.8 g/dL (ref 3.5–5.2)
Alkaline Phosphatase: 54 U/L (ref 39–117)
BUN: 10 mg/dL (ref 6–23)
CO2: 28 meq/L (ref 19–32)
Calcium: 9.7 mg/dL (ref 8.4–10.5)
Chloride: 102 meq/L (ref 96–112)
Creatinine, Ser: 0.74 mg/dL (ref 0.40–1.20)
GFR: 104.11 mL/min (ref >60.00)
Glucose, Bld: 80 mg/dL (ref 70–99)
Potassium: 4.2 meq/L (ref 3.5–5.1)
Sodium: 138 meq/L (ref 135–145)
Total Bilirubin: 0.4 mg/dL (ref 0.2–1.2)
Total Protein: 7.5 g/dL (ref 6.0–8.3)

## 2023-11-06 MED ORDER — FOLIC ACID 1 MG PO TABS: 1.0000 mg | ORAL_TABLET | Freq: Every day | ORAL | 3 refills | Status: AC

## 2023-11-06 MED ORDER — NA SULFATE-K SULFATE-MG SULF 17.5-3.13-1.6 GM/177ML PO SOLN: 1.0000 | Freq: Once | ORAL | 0 refills | Status: AC

## 2023-11-06 MED ORDER — SULFASALAZINE 500 MG PO TABS: 1000.0000 mg | ORAL_TABLET | Freq: Two times a day (BID) | ORAL | 3 refills | Status: DC

## 2023-11-06 NOTE — Patient Instructions (Signed)
 Your provider has requested that you go to the basement level for lab work before leaving today. Press "B" on the elevator. The lab is located at the first door on the left as you exit the elevator.   We have sent the following medications to your pharmacy for you to pick up at your convenience: Sulfasalazine and Folic Acid  Start Benefiber or Citrucel 2 teaspoons in 8 ounces of liquid daily and may increase to twice daily if tolerated.   Increase water intake to 6 8 oz glasses daily.   Start Miralax 1 capful daily in 8 ounces of liquid.  You have been scheduled for a colonoscopy. Please follow written instructions given to you at your visit today.   If you use inhalers (even only as needed), please bring them with you on the day of your procedure.  DO NOT TAKE 7 DAYS PRIOR TO TEST- Trulicity (dulaglutide) Ozempic, Wegovy (semaglutide) Mounjaro (tirzepatide) Bydureon Bcise (exanatide extended release)  DO NOT TAKE 1 DAY PRIOR TO YOUR TEST Rybelsus (semaglutide) Adlyxin (lixisenatide) Victoza (liraglutide) Byetta (exanatide) ___________________________________________________________________________  _______________________________________________________  If your blood pressure at your visit was 140/90 or greater, please contact your primary care physician to follow up on this.  _______________________________________________________  If you are age 74 or older, your body mass index should be between 23-30. Your Body mass index is 28.32 kg/m. If this is out of the aforementioned range listed, please consider follow up with your Primary Care Provider.  If you are age 73 or younger, your body mass index should be between 19-25. Your Body mass index is 28.32 kg/m. If this is out of the aformentioned range listed, please consider follow up with your Primary Care Provider.   ________________________________________________________  The [facility] providers would like to encourage  you to use [facility] to communicate with providers for non-urgent requests or questions.  Due to long hold times on the telephone, sending your provider a message by [facility] may be a faster and more efficient way to get a response.  Please allow 48 business hours for a response.  Please remember that this is for non-urgent requests.  _______________________________________________________

## 2023-11-06 NOTE — Progress Notes (Signed)
 Chief Complaint: Follow-up ulcerative pancolitis  HPI:    Kim Alvarez is a 38 year old female with a past medical history as listed below including ulcerative pancolitis, sickle cell trait multiple others, known to Dr. 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      03/02/2021 office visit with me to discuss change in bowel habits to watery and red with an increase in generalized abdominal soreness.  Also some "neck soreness and tightness" and occasional trouble swallowing.  She had vague history of possible colon cancer in her father and was unsure about the rest.  At that time discussed GI pathogen panel and then possibly EGD and colonoscopy.  Gave her a trial of Dicyclomine.    04/06/2021 EGD with mild nonspecific gastritis and otherwise normal.  Biopsy showed mild chronic gastritis.  Colonoscopy with mild to moderate distal colitis (proctitis) and otherwise normal.  Biopsy showed mildly active chronic colitis consistent with IBD.  Patient started on Rowasa enemas 1 enema nightly.    04/18/2022 CBC with MCV minimally decreased at 79.2 and otherwise normal.  BMP normal.    04/24/2022 office visit with internal medicine and discussed pulmonary embolism.  She is on Xarelto.  Also discussed ulcerative colitis.  Discussed fecal urgency.    07/24/2022 patient seen in clinic and told me she does not have any further rectal bleeding for which she was seen initially but did have occasional diarrhea and occasional abdominal pain.  Discussed diagnosis of ulcerative proctitis and we prescribed Rowasa enemas nightly for the next 2 to 3 months to see if this changes any of her symptoms.  Explained that if she did not feel any different then she would likely need repeat flex sig/colonoscopy to evaluate state of her symptoms.    09/10/2022 patient tells me she had only done 3 of the Rowasa enemas in total because they were uncomfortable, she had no further bleeding.  Occasional sharp  pains in her abdomen.  At that point again discussed her diagnosis.  Recommended a flex sig to get new biopsies and see what the state of her disease was.    10/15/2022 flex sigmoidoscopy with Dr. Lavon Paganini with moderately active left-sided ulcerative colitis and nonbleeding external and internal hemorrhoids.  Patient started on Apriso 0.375 g 4 caps p.o. every morning.  Also told to use Benefiber 2 teaspoons p.o. twice daily.Also given Prednisone taper starting at 10 mg daily for 7 days followed by 5 mg daily for 7 days and then stop.  Biopsies were consistent with ulcerative colitis in the left colon.  Repeat colonoscopy recommended in a year.  Discussed that if patient had persistent symptoms then she would benefit from starting biologic such as Velsipity 2 mg daily.    11/29/2022 patient seen in clinic and at that time using Sulfasalazine 2 tablets twice a day as well as Folic acid 1 mg a day and Benefiber.  She was doing well.  At that time sulfasalazine continued 1000 mg twice a day, continued on Folic acid and Benefiber.    Today, the patient presents to clinic and tells me that she has actually been doing fairly well.  She did have a bad day yesterday where she had some pain in the left side of her abdomen which she describes as stabbing lasted for about 2 to 3 hours.  Tells me that she had her husband massage her abdomen and actually when he was pressing on the right side it also hurt.  She  went for a walk at the gym and it went away.  Does describe that she has had some pebbly looking stools recently and otherwise they are solid.  No diarrhea or blood in her stool.  Continues on Sulfasalazine 500 mg 2 tabs twice a day and Folic acid as well as Benefiber.  Tells me she drinks at least 4 glasses of water a day.    Some concern over the labs she had done on campus regarding a hemoglobin A1c above 6.  She has not seen her PCP in some time.    Denies fever, chills or weight loss.     Past Medical History:   Diagnosis Date   Abnormal Pap smear 2007   Allergy    Chlamydia 2013   treated   Family history of sickle cell anemia (Hgb SS) in mother    FHx: cancer    Fibroids    Fibroids during pregnancy   H/O varicella    IUD (intrauterine device) in place 08/2010   Pulmonary embolism (HCC) 04/12/2022   Pulmonary embolism associated with estrogen containing oral contraceptives.   Routine adult health maintenance 04/24/2022   Second-degree perineal laceration, with delivery 02/23/2017   Sickle cell trait (HCC)    SVD (spontaneous vaginal delivery) : waterbirth  02/23/2017   Yeast infection     Past Surgical History:  Procedure Laterality Date   NO PAST SURGERIES     PULMONARY EMBOLISM SURGERY     WISDOM TOOTH EXTRACTION Bilateral     Current Outpatient Medications  Medication Sig Dispense Refill   escitalopram (LEXAPRO) 10 MG tablet Take 1 tablet (10 mg total) by mouth daily. 90 tablet 3   folic acid (FOLVITE) 1 MG tablet Take 1 tablet (1 mg total) by mouth daily. 90 tablet 0   sulfaSALAzine (AZULFIDINE) 500 MG tablet Take 2 tablets (1,000 mg total) by mouth 2 (two) times daily. With folic acid 1 mg daily. Patient needs follow up appointment for future refills. Please call [phone] to schedule an appointment. 360 tablet 0   No current facility-administered medications for this visit.    Allergies as of 11/06/2023   (No Known Allergies)    Family History  Problem Relation Age of Onset   Sickle cell anemia Mother        alive   Colon cancer Father    Sickle cell anemia Maternal Aunt        alive   Sickle cell anemia Maternal Aunt        died   Sickle cell anemia Maternal Uncle        alive   Sickle cell anemia Maternal Uncle        died   Breast cancer Paternal Aunt    Esophageal cancer Neg Hx    Pancreatic cancer Neg Hx    Stomach cancer Neg Hx    Liver disease Neg Hx     Social History   Socioeconomic History   Marital status: Married    Spouse name: Not on  file   Number of children: Not on file   Years of education: Not on file   Highest education level: Not on file  Occupational History   Not on file  Tobacco Use   Smoking status: Never    Passive exposure: Never   Smokeless tobacco: Never  Vaping Use   Vaping status: Never Used  Substance and Sexual Activity   Alcohol use: Yes    Alcohol/week: 0.0 standard drinks of alcohol  Comment: socially    Drug use: No   Sexual activity: Yes    Partners: Male    Birth control/protection: None    Comment: last IC- this morning   Other Topics Concern   Not on file  Social History Narrative   Not on file   Social Drivers of Health   Financial Resource Strain: Not on file  Food Insecurity: Not on file  Transportation Needs: Not on file  Physical Activity: Not on file  Stress: Not on file  Social Connections: Not on file  Intimate Partner Violence: Not on file    Review of Systems:    Constitutional: No weight loss, fever, or chills Cardiovascular: No chest pain  Respiratory: No SOB  Gastrointestinal: See HPI and otherwise negative   Physical Exam:  Vital signs: BP 98/72   Pulse 68   Ht 5\' 4"  (1.626 m)   Wt 165 lb (74.8 kg)   BMI 28.32 kg/m   Constitutional:   Pleasant AA female appears to be in NAD, Well developed, Well nourished, alert and cooperative Respiratory: Respirations even and unlabored. Lungs clear to auscultation bilaterally.   No wheezes, crackles, or rhonchi.  Cardiovascular: Normal S1, S2. No MRG. Regular rate and rhythm. No peripheral edema, cyanosis or pallor.  Gastrointestinal:  Soft, nondistended, mild ttp on right side of abdomen. No rebound or guarding. Normal bowel sounds. No appreciable masses or hepatomegaly. Skin:   Dry and intact without significant lesions or rashes. Psychiatric: Oriented to person, place and time. Demonstrates good judgement and reason without abnormal affect or behaviors.  RELEVANT LABS AND IMAGING: CBC    Component Value  Date/Time   WBC 7.0 04/18/2022 1201   RBC 4.86 04/18/2022 1201   HGB 12.6 04/18/2022 1201   HCT 38.5 04/18/2022 1201   PLT 392 04/18/2022 1201   MCV 79.2 (L) 04/18/2022 1201   MCH 25.9 (L) 04/18/2022 1201   MCHC 32.7 04/18/2022 1201   RDW 12.9 04/18/2022 1201   LYMPHSABS 2.2 01/28/2014 1453   MONOABS 0.5 01/28/2014 1453   EOSABS 0.2 01/28/2014 1453   BASOSABS 0.0 01/28/2014 1453    CMP     Component Value Date/Time   NA 137 10/31/2022 1626   K 3.7 10/31/2022 1626   CL 104 10/31/2022 1626   CO2 25 10/31/2022 1626   GLUCOSE 93 10/31/2022 1626   BUN 6 10/31/2022 1626   CREATININE 0.67 10/31/2022 1626   CALCIUM 9.3 10/31/2022 1626   GFRNONAA >60 10/31/2022 1626    Assessment: 1.  Ulcerative proctitis: Currently on Sulfasalazine 500 mg tabs 2 tabs twice a day, doing well with a little bit of constipation and what sounds like gas pain recently 2.  Abdominal pain: Just 1 day that she describes of a sharp pain in her abdomen mostly on the left side, some radiation to the right to the lasted for a couple of hours better after walking; likely gas pain  Plan: 1.  Continue Sulfasalazine 500 mg tabs 2 tabs twice a day, sent in refills for a year 2.  Continue folic acid 1 g daily sent in refills for your 3.  Continue Benefiber daily 4.  Discussed adding in MiraLAX once a day for constipation 5.  Recommended increased water intake to at least six 8 ounce glasses a day.  Did discuss the real recommendations are half her body weight in ounces of water a day.  She certainly does not sound like she is getting enough water. 6.  Continue exercise  7.  Scheduled patient for colonoscopy in the [facility] with Dr. Lavon Paganini.  Did provide the patient a detailed list of risks for the procedure and she agrees to proceed. Patient is appropriate for endoscopic procedure(s) in the ambulatory ([facility]) setting.  8.  Patient to follow in clinic per recommendations from Dr. Lavon Paganini after time of procedure.  Hyacinth Meeker, PA-C [facility] 11/06/2023, 9:10 AM  Cc: Marrianne Mood, MD

## 2023-12-08 ENCOUNTER — Encounter: Payer: Self-pay | Admitting: Gastroenterology

## 2023-12-09 DIAGNOSIS — Z01419 Encounter for gynecological examination (general) (routine) without abnormal findings: Secondary | ICD-10-CM | POA: Diagnosis not present

## 2023-12-14 ENCOUNTER — Encounter: Payer: Self-pay | Admitting: Gastroenterology

## 2023-12-15 ENCOUNTER — Telehealth: Payer: Self-pay

## 2023-12-15 ENCOUNTER — Encounter: Payer: Self-pay | Admitting: Gastroenterology

## 2023-12-15 ENCOUNTER — Ambulatory Visit (AMBULATORY_SURGERY_CENTER): Admitting: Gastroenterology

## 2023-12-15 VITALS — BP 108/68 | HR 74 | Temp 98.8°F | Resp 15 | Ht 64.0 in | Wt 165.0 lb

## 2023-12-15 DIAGNOSIS — K5730 Diverticulosis of large intestine without perforation or abscess without bleeding: Secondary | ICD-10-CM

## 2023-12-15 DIAGNOSIS — R1032 Left lower quadrant pain: Secondary | ICD-10-CM

## 2023-12-15 DIAGNOSIS — K648 Other hemorrhoids: Secondary | ICD-10-CM

## 2023-12-15 DIAGNOSIS — K5120 Ulcerative (chronic) proctitis without complications: Secondary | ICD-10-CM | POA: Diagnosis not present

## 2023-12-15 DIAGNOSIS — D123 Benign neoplasm of transverse colon: Secondary | ICD-10-CM | POA: Diagnosis not present

## 2023-12-15 DIAGNOSIS — K5900 Constipation, unspecified: Secondary | ICD-10-CM

## 2023-12-15 DIAGNOSIS — K644 Residual hemorrhoidal skin tags: Secondary | ICD-10-CM

## 2023-12-15 MED ORDER — SODIUM CHLORIDE 0.9 % IV SOLN: 500.0000 mL | Freq: Once | INTRAVENOUS | Status: DC

## 2023-12-15 NOTE — Progress Notes (Signed)
 Called to room to assist during endoscopic procedure.  Patient ID and intended procedure confirmed with present staff. Received instructions for my participation in the procedure from the performing physician.

## 2023-12-15 NOTE — Procedures (Signed)
 Pt's states no medical or surgical changes since previsit or office visit.

## 2023-12-15 NOTE — Patient Instructions (Signed)
Resume previous diet Continue present medications Await pathology results  Handouts/information given for polyps, diverticulosis  YOU HAD AN ENDOSCOPIC PROCEDURE TODAY AT THE [facility]:   Refer to the procedure report that was given to you for any specific questions about what was found during the examination.  If the procedure report does not answer your questions, please call your gastroenterologist to clarify.  If you requested that your care partner not be given the details of your procedure findings, then the procedure report has been included in a sealed envelope for you to review at your convenience later.  YOU SHOULD EXPECT: Some feelings of bloating in the abdomen. Passage of more gas than usual.  Walking can help get rid of the air that was put into your GI tract during the procedure and reduce the bloating. If you had a lower endoscopy (such as a colonoscopy or flexible sigmoidoscopy) you may notice spotting of blood in your stool or on the toilet paper. If you underwent a bowel prep for your procedure, you may not have a normal bowel movement for a few days.  Please Note:  You might notice some irritation and congestion in your nose or some drainage.  This is from the oxygen used during your procedure.  There is no need for concern and it should clear up in a day or so.  SYMPTOMS TO REPORT IMMEDIATELY:  Following lower endoscopy (colonoscopy):  Excessive amounts of blood in the stool  Significant tenderness or worsening of abdominal pains  Swelling of the abdomen that is new, acute  Fever of 100F or higher  For urgent or emergent issues, a gastroenterologist can be reached at any hour by calling [phone]. Do not use MyChart messaging for urgent concerns.    DIET:  We do recommend a small meal at first, but then you may proceed to your regular diet.  Drink plenty of fluids but you should avoid alcoholic beverages for 24 hours.  ACTIVITY:  You should plan to  take it easy for the rest of today and you should NOT DRIVE or use heavy machinery until tomorrow (because of the sedation medicines used during the test).    FOLLOW UP: Our staff will call the number listed on your records the next business day following your procedure.  We will call around 7:15- 8:00 am to check on you and address any questions or concerns that you may have regarding the information given to you following your procedure. If we do not reach you, we will leave a message.     If any biopsies were taken you will be contacted by phone or by letter within the next 1-3 weeks.  Please call us at [phone] if you have not heard about the biopsies in 3 weeks.    SIGNATURES/CONFIDENTIALITY: You and/or your care partner have signed paperwork which will be entered into your electronic medical record.  These signatures attest to the fact that that the information above on your After Visit Summary has been reviewed and is understood.  Full responsibility of the confidentiality of this discharge information lies with you and/or your care-partner. 

## 2023-12-15 NOTE — Op Note (Signed)
 [facility] Patient Name: Kim Alvarez Procedure Date: 12/15/2023 2:50 PM MRN: [id number] Endoscopist: Sergio Dandy , MD, [id number] Age: 38 Referring MD:  Date of Birth: 01-27-1986 Gender: Female Account #: [id number] Procedure:                Colonoscopy Indications:              Follow-up of chronic ulcerative proctitis, Disease                            activity assessment of chronic ulcerative                            proctitis, Change in bowel habits, Constipation Medicines:                Monitored Anesthesia Care Procedure:                Pre-Anesthesia Assessment:                           - Prior to the procedure, a History and Physical                            was performed, and patient medications and                            allergies were reviewed. The patient's tolerance of                            previous anesthesia was also reviewed. The risks                            and benefits of the procedure and the sedation                            options and risks were discussed with the patient.                            All questions were answered, and informed consent                            was obtained. Prior Anticoagulants: The patient has                            taken no anticoagulant or antiplatelet agents. ASA                            Grade Assessment: II - A patient with mild systemic                            disease. After reviewing the risks and benefits,                            the patient was deemed in satisfactory condition to  undergo the procedure.                           After obtaining informed consent, the colonoscope                            was passed under direct vision. Throughout the                            procedure, the patient's blood pressure, pulse, and                            oxygen saturations were monitored continuously. The                            [id number]-[id number]  Colonoscope [id number] was introduced                            through the anus and advanced to the the terminal                            ileum, with identification of the appendiceal                            orifice and IC valve. The colonoscopy was performed                            without difficulty. The patient tolerated the                            procedure well. The quality of the bowel                            preparation was good. The terminal ileum, ileocecal                            valve, appendiceal orifice, and rectum were                            photographed. Scope In: 3:03:06 PM Scope Out: 3:21:37 PM Scope Withdrawal Time: 0 hours 13 minutes 26 seconds  Total Procedure Duration: 0 hours 18 minutes 31 seconds  Findings:                 The perianal and digital rectal examinations were                            normal.                           The terminal ileum appeared normal. Colon mucosa                            appeared normal  A 5 mm polyp was found in the transverse colon. The                            polyp was sessile. The polyp was removed with a                            cold snare. Resection and retrieval were complete.                           Scattered small-mouthed diverticula were found in                            the sigmoid colon.                           Normal mucosa was found in the rectum. Biopsies                            were taken with a cold forceps for histology.                           Non-bleeding external and internal hemorrhoids were                            found during retroflexion. The hemorrhoids were                            medium-sized. Complications:            No immediate complications. Estimated Blood Loss:     Estimated blood loss was minimal. Impression:               - The examined portion of the ileum was normal.                           - One 5 mm polyp in the  transverse colon, removed                            with a cold snare. Resected and retrieved.                           - Diverticulosis in the sigmoid colon.                           - Normal mucosa in the rectum. Biopsied.                           - Non-bleeding external and internal hemorrhoids. Recommendation:           - Patient has a contact number available for                            emergencies. The signs and symptoms of potential  delayed complications were discussed with the                            patient. Return to normal activities tomorrow.                            Written discharge instructions were provided to the                            patient.                           - Resume previous diet.                           - Continue present medications.                           - Await pathology results.                           - Repeat colonoscopy in 5 years for surveillance                            based on pathology results.                           - Continue Sulfasalazine                            - Return to GI clinic in 6 months. Chalene Treu V. Diarra Ceja, MD 12/15/2023 3:28:11 PM This report has been signed electronically.

## 2023-12-15 NOTE — Progress Notes (Signed)
 [facility] History and Physical   Primary Care Physician:  Adria Hopkins, MD   Reason for Procedure:  Ulcerative proctitis, LLQ abd pain, constipation  Plan:    colonoscopy with possible interventions as needed     HPI: Kim Alvarez is a very pleasant 38 y.o. female here for colonoscopy for LLQ pain, change in bowel habits with constipation and ulcerative proctitis.  Please refer to office visit note by Reginal Capra for additional details  The risks and benefits as well as alternatives of endoscopic procedure(s) have been discussed and reviewed. All questions answered. The patient agrees to proceed.    Past Medical History:  Diagnosis Date   Abnormal Pap smear 2007   Allergy    Chlamydia 2013   treated   Family history of sickle cell anemia (Hgb SS) in mother    FHx: cancer    Fibroids    Fibroids during pregnancy   H/O varicella    IUD (intrauterine device) in place 08/2010   Pulmonary embolism (HCC) 04/12/2022   Pulmonary embolism associated with estrogen containing oral contraceptives.   Routine adult health maintenance 04/24/2022   Second-degree perineal laceration, with delivery 02/23/2017   Sickle cell trait (HCC)    SVD (spontaneous vaginal delivery) : waterbirth  02/23/2017   Yeast infection     Past Surgical History:  Procedure Laterality Date   NO PAST SURGERIES     PULMONARY EMBOLISM SURGERY     WISDOM TOOTH EXTRACTION Bilateral     Prior to Admission medications   Medication Sig Start Date End Date Taking? Authorizing Provider  folic acid  (FOLVITE ) 1 MG tablet Take 1 tablet (1 mg total) by mouth daily. 11/06/23  Yes Graciella Lavender, PA  sulfaSALAzine  (AZULFIDINE ) 500 MG tablet Take 2 tablets (1,000 mg total) by mouth 2 (two) times daily. With folic acid  1 mg daily. 11/06/23  Yes Graciella Lavender, PA  escitalopram  (LEXAPRO ) 10 MG tablet Take 1 tablet (10 mg total) by mouth daily. Patient not taking: Reported on  11/06/2023 12/09/22 12/09/23  Adria Hopkins, MD    Current Outpatient Medications  Medication Sig Dispense Refill   folic acid  (FOLVITE ) 1 MG tablet Take 1 tablet (1 mg total) by mouth daily. 90 tablet 3   sulfaSALAzine  (AZULFIDINE ) 500 MG tablet Take 2 tablets (1,000 mg total) by mouth 2 (two) times daily. With folic acid  1 mg daily. 360 tablet 3   escitalopram  (LEXAPRO ) 10 MG tablet Take 1 tablet (10 mg total) by mouth daily. (Patient not taking: Reported on 11/06/2023) 90 tablet 3   Current Facility-Administered Medications  Medication Dose Route Frequency Provider Last Rate Last Admin   0.9 %  sodium chloride  infusion  500 mL Intravenous Once Aury Scollard V, MD        Allergies as of 12/15/2023   (No Known Allergies)    Family History  Problem Relation Age of Onset   Sickle cell anemia Mother        alive   Colon cancer Father    Sickle cell anemia Maternal Aunt        alive   Sickle cell anemia Maternal Aunt        died   Sickle cell anemia Maternal Uncle        alive   Sickle cell anemia Maternal Uncle        died   Breast cancer Paternal Aunt    Esophageal cancer Neg Hx    Pancreatic cancer Neg Hx  Stomach cancer Neg Hx    Liver disease Neg Hx     Social History   Socioeconomic History   Marital status: Married    Spouse name: Not on file   Number of children: Not on file   Years of education: Not on file   Highest education level: Not on file  Occupational History   Not on file  Tobacco Use   Smoking status: Never    Passive exposure: Never   Smokeless tobacco: Never  Vaping Use   Vaping status: Never Used  Substance and Sexual Activity   Alcohol use: Yes    Alcohol/week: 0.0 standard drinks of alcohol    Comment: socially    Drug use: No   Sexual activity: Yes    Partners: Male    Birth control/protection: None    Comment: last IC- this morning   Other Topics Concern   Not on file  Social History Narrative   Not on file   Social  Drivers of Health   Financial Resource Strain: Not on file  Food Insecurity: Not on file  Transportation Needs: Not on file  Physical Activity: Not on file  Stress: Not on file  Social Connections: Not on file  Intimate Partner Violence: Not on file    Review of Systems:  All other review of systems negative except as mentioned in the HPI.  Physical Exam: Vital signs in last 24 hours: BP 114/65   Pulse 71   Temp 98.8 F (37.1 C)   Ht 5\' 4"  (1.626 m)   Wt 165 lb (74.8 kg)   SpO2 100%   BMI 28.32 kg/m  General:   Alert, NAD Lungs:  Clear .   Heart:  Regular rate and rhythm Abdomen:  Soft, nontender and nondistended. Neuro/Psych:  Alert and cooperative. Normal mood and affect. A and O x 3  Reviewed labs, radiology imaging, old records and pertinent past GI work up  Patient is appropriate for planned procedure(s) and anesthesia in an ambulatory setting   K. Veena Jennetta Flood , MD [phone]

## 2023-12-15 NOTE — Progress Notes (Signed)
 Pt resting comfortably. VSS. Airway intact. SBAR complete to RN. All questions answered.

## 2023-12-15 NOTE — Telephone Encounter (Signed)
 Spoke with the patient. She has followed her diet as instructed except on 12/13/23 she ate some raw veggies. She communicated through My Chart with concerns if she should cancel her colonoscopy. Patient has prep according to instructions. She will come at 2:00 pm today as scheduled.

## 2023-12-16 ENCOUNTER — Telehealth: Payer: Self-pay

## 2023-12-16 NOTE — Telephone Encounter (Signed)
 Attempted to reach patient for follow up phone call. No answer, left voicemail to contact Dr. Allean Aran office with any questions or concerns.

## 2023-12-18 LAB — SURGICAL PATHOLOGY

## 2023-12-18 NOTE — Telephone Encounter (Signed)
 Patient had scheduled colonoscopy on 12/15/23.

## 2024-01-19 ENCOUNTER — Ambulatory Visit: Payer: Self-pay | Admitting: Gastroenterology

## 2024-05-14 DIAGNOSIS — F439 Reaction to severe stress, unspecified: Secondary | ICD-10-CM | POA: Diagnosis not present

## 2024-05-26 DIAGNOSIS — F439 Reaction to severe stress, unspecified: Secondary | ICD-10-CM | POA: Diagnosis not present

## 2024-08-25 ENCOUNTER — Ambulatory Visit: Payer: Self-pay | Admitting: Student

## 2024-08-26 ENCOUNTER — Ambulatory Visit: Admitting: Student

## 2024-08-26 VITALS — BP 110/77 | HR 86 | Temp 98.1°F | Ht 64.0 in | Wt 155.0 lb

## 2024-08-26 DIAGNOSIS — K649 Unspecified hemorrhoids: Secondary | ICD-10-CM

## 2024-08-26 DIAGNOSIS — K640 First degree hemorrhoids: Secondary | ICD-10-CM

## 2024-08-26 DIAGNOSIS — K5190 Ulcerative colitis, unspecified, without complications: Secondary | ICD-10-CM

## 2024-08-26 DIAGNOSIS — Z0000 Encounter for general adult medical examination without abnormal findings: Secondary | ICD-10-CM

## 2024-08-26 DIAGNOSIS — K51211 Ulcerative (chronic) proctitis with rectal bleeding: Secondary | ICD-10-CM

## 2024-08-26 MED ORDER — BUDESONIDE ER 9 MG PO TB24: 9.0000 mg | ORAL_TABLET | Freq: Every morning | ORAL | 0 refills | Status: DC

## 2024-08-26 NOTE — Assessment & Plan Note (Addendum)
 Patient presents with a 4-week history of urge for bowel movements resulting in loose stool that is associated with right lower quadrant abdominal pain.  She reports that the symptoms are very similar to when she was first diagnosed with ulcerative colitis.  She has the urge to have a bowel movement and loose stools daily.  She is currently on a regimen of sulfasalazine  500 mg twice daily with folic acid .  She last saw [facility] in March 2025.  She denies ever having a ulcerative colitis flare that she is aware of; however, she is a limited diagnosed with UC since 2024.  Additionally, she reports blood mixed in with the stool over the past week but fortunately denies fever, chills, weight loss. On exam, there is no abdominal pain, she is afebrile, and she does have a change in her weight by 10 pounds since March 2025.  Currently concern for an active ulcerative colitis flare.  Plan: -Start budesonide  9 mg daily for 8 weeks -Patient encouraged to call her GI doctor for further evaluation and recommendations -Sent staff message to GI provider  -Continue sulfasalazine  500 mg twice daily -Ordered CBC, ESR, CRP

## 2024-08-26 NOTE — Patient Instructions (Signed)
 Thank you, Ms.Arnette LOISE Novak for allowing us  to provide your care today. Today we discussed changes in bowel habits.  Please take budesonide  9mg  once a day every morning. Call your GI doctor and notify them that we are concerned for an active UC flare.   You will need to follow up with your GI doctor.  We ordered some labs for both your UC and general health care monitoring.  For the hemorrhoid, keep eating fiber, drinking water, and avoid sitting on the toilet and staining for a long time.   I have ordered the following labs for you:   Lab Orders         Hemoglobin A1c         CBC no Diff         Basic metabolic panel with GFR         Sed Rate (ESR)         CRP (C-Reactive Protein)         Lipid Profile        I have ordered the following medication/changed the following medications:   Stop the following medications: There are no discontinued medications.   Start the following medications: Meds ordered this encounter  Medications   Budesonide  ER 9 MG TB24    Sig: Take 1 tablet (9 mg total) by mouth in the morning.    Dispense:  60 tablet    Refill:  0     Follow up: 2-3 months or sooner if needed   Remember:   Should you have any questions or concerns please call the internal medicine clinic at [phone].     Please note that our late policy has changed.  If you are more than 15 minutes late to your appointment, you may be asked to reschedule your appointment.  Dr. Kandis, D.O. [facility]

## 2024-08-26 NOTE — Progress Notes (Signed)
 "  Established Patient Office Visit  Subjective   Patient ID: CHARLE MCLAURIN, female    DOB: 1986/01/02  Age: 39 y.o. MRN: [id number]  Chief Complaint  Patient presents with   Annual Exam   Medication Refill   Bloated    Digestion issues   Hemorrhoids    Possible hemorrhoid     BIANA HAGGAR is a 39 y.o. who presents to the clinic for UC flare and possible hemrrhoid. Please see problem based assessment and plan for additional details.    Patient Active Problem List   Diagnosis Date Noted   Hemorrhoid 08/26/2024   MDD (major depressive disorder), single episode, mild 12/09/2022   Routine adult health maintenance 04/24/2022   Ulcerative colitis (HCC) 04/12/2022   Sickle cell trait    Goiter, nontoxic, multinodular       Objective:     BP 110/77 (BP Location: Right Arm, Patient Position: Sitting, Cuff Size: Normal)   Pulse 86   Temp 98.1 F (36.7 C) (Oral)   Ht 5' 4 (1.626 m)   Wt 155 lb (70.3 kg)   SpO2 98%   BMI 26.61 kg/m  BP Readings from Last 3 Encounters:  08/26/24 110/77  12/15/23 108/68  11/06/23 98/72   Wt Readings from Last 3 Encounters:  08/26/24 155 lb (70.3 kg)  12/15/23 165 lb (74.8 kg)  11/06/23 165 lb (74.8 kg)      Physical Exam Vitals reviewed. Exam conducted with a chaperone present.  Constitutional:      General: She is not in acute distress.    Appearance: She is not ill-appearing, toxic-appearing or diaphoretic.  Cardiovascular:     Rate and Rhythm: Normal rate and regular rhythm.  Pulmonary:     Effort: Pulmonary effort is normal.     Breath sounds: Normal breath sounds.  Abdominal:     General: There is no distension.     Palpations: Abdomen is soft.     Tenderness: There is no abdominal tenderness. There is no guarding.  Genitourinary:    Comments: Non bleeding, non thrombosed external hemorrhoid at the 6 O clock position. Chaperone present during exam.  Skin:    General: Skin is warm and dry.  Neurological:     Mental  Status: She is alert.  Psychiatric:        Mood and Affect: Mood and affect normal.    No results found for any visits on 08/26/24.  Last CBC Lab Results  Component Value Date   WBC 5.3 11/06/2023   HGB 13.0 11/06/2023   HCT 40.6 11/06/2023   MCV 83.3 11/06/2023   MCH 25.9 (L) 04/18/2022   RDW 13.5 11/06/2023   PLT 322.0 11/06/2023   Last metabolic panel Lab Results  Component Value Date   GLUCOSE 80 11/06/2023   NA 138 11/06/2023   K 4.2 11/06/2023   CL 102 11/06/2023   CO2 28 11/06/2023   BUN 10 11/06/2023   CREATININE 0.74 11/06/2023   GFR 104.11 11/06/2023   CALCIUM 9.7 11/06/2023   PROT 7.5 11/06/2023   ALBUMIN 4.8 11/06/2023   BILITOT 0.4 11/06/2023   ALKPHOS 54 11/06/2023   AST 17 11/06/2023   ALT 16 11/06/2023   ANIONGAP 8 10/31/2022      The ASCVD Risk score (Arnett DK, et al., 2019) failed to calculate for the following reasons:   The 2019 ASCVD risk score is only valid for ages 93 to 19   * - Cholesterol units were assumed  Assessment & Plan:   Problem List Items Addressed This Visit       Cardiovascular and Mediastinum   Hemorrhoid   Patient has a nonthrombosed and nonbleeding hemorrhoid present at the 6 o'clock position on physical exam.  Patient denies any symptoms of current rectal pain with defecation. Plan: - Encouraged hydration, fiber intake, and avoiding of straining with bowel movements        Digestive   Ulcerative colitis (HCC)   Patient presents with a 4-week history of urge for bowel movements resulting in loose stool that is associated with right lower quadrant abdominal pain.  She reports that the symptoms are very similar to when she was first diagnosed with ulcerative colitis.  She has the urge to have a bowel movement and loose stools daily.  She is currently on a regimen of sulfasalazine  500 mg twice daily with folic acid .  She last saw [facility] in March 2025.  She denies ever having a ulcerative colitis flare that  she is aware of; however, she is a limited diagnosed with UC since 2024.  Additionally, she reports blood mixed in with the stool over the past week but fortunately denies fever, chills, weight loss. On exam, there is no abdominal pain, she is afebrile, and she does have a change in her weight by 10 pounds since March 2025.  Currently concern for an active ulcerative colitis flare.  Plan: -Start budesonide  9 mg daily for 8 weeks -Patient encouraged to call her GI doctor for further evaluation and recommendations -Continue sulfasalazine  500 mg twice daily -Ordered CBC, ESR, CRP      Relevant Orders   CBC no Diff   Basic metabolic panel with GFR   Sed Rate (ESR)   CRP (C-Reactive Protein)   Other Visit Diagnoses       Healthcare maintenance    -  Primary   Relevant Orders   Hemoglobin A1c   Basic metabolic panel with GFR   Lipid Profile       Return in about 2 months (around 10/24/2024) for chronic conditions .    Damien Lease, DO  "

## 2024-08-26 NOTE — Assessment & Plan Note (Signed)
 Patient has a nonthrombosed and nonbleeding hemorrhoid present at the 6 o'clock position on physical exam.  Patient denies any symptoms of current rectal pain with defecation. Plan: - Encouraged hydration, fiber intake, and avoiding of straining with bowel movements

## 2024-08-27 ENCOUNTER — Encounter: Payer: Self-pay | Admitting: Student

## 2024-08-27 LAB — BASIC METABOLIC PANEL WITH GFR
BUN/Creatinine Ratio: 11 (ref 9–23)
BUN: 10 mg/dL (ref 6–20)
CO2: 19 mmol/L — ABNORMAL LOW (ref 20–29)
Calcium: 9.5 mg/dL (ref 8.7–10.2)
Chloride: 103 mmol/L (ref 96–106)
Creatinine, Ser: 0.9 mg/dL (ref 0.57–1.00)
Glucose: 79 mg/dL (ref 70–99)
Potassium: 4.4 mmol/L (ref 3.5–5.2)
Sodium: 140 mmol/L (ref 134–144)
eGFR: 84 mL/min/1.73

## 2024-08-27 LAB — CBC
Hematocrit: 39.1 % (ref 34.0–46.6)
Hemoglobin: 12.1 g/dL (ref 11.1–15.9)
MCH: 26.7 pg (ref 26.6–33.0)
MCHC: 30.9 g/dL — ABNORMAL LOW (ref 31.5–35.7)
MCV: 86 fL (ref 79–97)
Platelets: 291 x10E3/uL (ref 150–450)
RBC: 4.53 x10E6/uL (ref 3.77–5.28)
RDW: 12.5 % (ref 11.7–15.4)
WBC: 4.7 x10E3/uL (ref 3.4–10.8)

## 2024-08-27 LAB — LIPID PANEL
Chol/HDL Ratio: 4.6 ratio — ABNORMAL HIGH (ref 0.0–4.4)
Cholesterol, Total: 269 mg/dL — ABNORMAL HIGH (ref 100–199)
HDL: 59 mg/dL
LDL Chol Calc (NIH): 200 mg/dL — ABNORMAL HIGH (ref 0–99)
Triglycerides: 66 mg/dL (ref 0–149)
VLDL Cholesterol Cal: 10 mg/dL (ref 5–40)

## 2024-08-27 LAB — HEMOGLOBIN A1C
Est. average glucose Bld gHb Est-mCnc: 105 mg/dL
Hgb A1c MFr Bld: 5.3 % (ref 4.8–5.6)

## 2024-08-27 LAB — C-REACTIVE PROTEIN: CRP: <1 mg/L (ref 0–10)

## 2024-08-27 LAB — SEDIMENTATION RATE: Sed Rate: 6 mm/h (ref 0–32)

## 2024-08-27 NOTE — Telephone Encounter (Signed)
 Called pt to let her know since the issue was not discussed at last visit, the doctor wants her to schedule an appt. Pt wanted to know if she will be charged for another OV (yes). Stated she only need to talk to someone ;hesitantly, she scheduled an appt for next week.

## 2024-08-30 ENCOUNTER — Ambulatory Visit: Payer: Self-pay | Admitting: Student

## 2024-08-30 ENCOUNTER — Other Ambulatory Visit: Payer: Self-pay | Admitting: Student

## 2024-08-30 DIAGNOSIS — R41840 Attention and concentration deficit: Secondary | ICD-10-CM

## 2024-08-30 DIAGNOSIS — E785 Hyperlipidemia, unspecified: Secondary | ICD-10-CM

## 2024-08-30 MED ORDER — ROSUVASTATIN CALCIUM 20 MG PO TABS: 20.0000 mg | ORAL_TABLET | Freq: Every day | ORAL | 11 refills | Status: DC

## 2024-08-30 NOTE — Progress Notes (Signed)
 Spoke with patient who has concerns about difficulty concentrating. Referral to psychiatry sent for formal ADHD testing.

## 2024-08-31 ENCOUNTER — Ambulatory Visit: Payer: Self-pay | Admitting: Student

## 2024-08-31 NOTE — Progress Notes (Signed)
 Internal Medicine Clinic Attending  Case discussed with the resident at the time of the visit.  We reviewed the resident's history and exam and pertinent patient test results.  I agree with the assessment, diagnosis, and plan of care documented in the resident's note.

## 2024-09-03 NOTE — Addendum Note (Signed)
 Addended by: KANDIS PERKINS on: 09/03/2024 08:59 AM   Modules accepted: Orders

## 2024-09-07 ENCOUNTER — Encounter: Payer: Self-pay | Admitting: Gastroenterology

## 2024-09-07 ENCOUNTER — Ambulatory Visit: Admitting: Gastroenterology

## 2024-09-07 ENCOUNTER — Other Ambulatory Visit: Payer: Self-pay | Admitting: Student

## 2024-09-07 VITALS — BP 90/60 | HR 68 | Ht 64.0 in | Wt 158.0 lb

## 2024-09-07 DIAGNOSIS — K5120 Ulcerative (chronic) proctitis without complications: Secondary | ICD-10-CM | POA: Diagnosis not present

## 2024-09-07 DIAGNOSIS — K649 Unspecified hemorrhoids: Secondary | ICD-10-CM

## 2024-09-07 DIAGNOSIS — K581 Irritable bowel syndrome with constipation: Secondary | ICD-10-CM

## 2024-09-07 DIAGNOSIS — K5900 Constipation, unspecified: Secondary | ICD-10-CM | POA: Diagnosis not present

## 2024-09-07 DIAGNOSIS — K641 Second degree hemorrhoids: Secondary | ICD-10-CM

## 2024-09-07 MED ORDER — SULFASALAZINE 500 MG PO TABS: 1000.0000 mg | ORAL_TABLET | Freq: Two times a day (BID) | ORAL | 3 refills | Status: AC

## 2024-09-07 NOTE — Progress Notes (Unsigned)
 "                Kim Alvarez    [id number]    1986/06/28  Primary Care Physician:McLendon, Ozell, MD  Referring Physician: Norrine Ozell, MD [address] [place], [address] [place],  [place] [address]   Chief complaint:  Constipation, UC, rectal bleeding  Discussed the use of AI scribe software for clinical note transcription with the patient, who gave verbal consent to proceed.  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  Altered Bowel Habits and Gastrointestinal Symptoms: - Onset in mid-December with constipation, incomplete evacuation, and episodes of sudden urgency - Passage of loose, cloudy stools with mucus - Intermittent abdominal pain and loud bowel sounds during symptomatic period - No ongoing abdominal pain or constipation since dietary modifications - Improvement in symptoms with intentional dietary changes and increased water intake - Drinking tea, especially in the morning and at night, has been helpful  Rectal Bleeding and Menstrual Cycle: - Currently menstruating - Bleeding approximately two weeks ago, unable to distinguish rectal from menstrual bleeding - No ongoing rectal bleeding outside of menstrual cycle  Hemorrhoidal Symptoms: - Hemorrhoid observed by husband during symptomatic period  Ulcerative Proctitis Management: - Diagnosed in 2024 following colonoscopy - Maintained on sulfasalazine , two tablets in the morning and two at night - No use of budesonide  or new gastrointestinal medications - Currently feeling better  Hypercholesterolemia Management: - Recent blood work revealed elevated cholesterol, normal inflammatory markers, and normal hemoglobin A1c - Prescribed Crestor  but did not initiate therapy due to cost - Steroid prescribed but not picked up due to expense and uncertainty about indication -  Upcoming appointment with cardiology for cholesterol management   Colonoscopy 12/15/23 - The examined portion of the ileum was normal. - One 5 mm polyp in the transverse colon, removed with a cold snare. Resected and retrieved. - Diverticulosis in the sigmoid colon. - Normal mucosa in the rectum.   1. Surgical [P], colon, transverse, polyp (1) :       TUBULAR ADENOMA (1) WITHOUT HIGH GRADE DYSPLASIA.        2. Surgical [P], colon, rectum :       COLONIC MUCOSA WITH BENIGN LYMPHOID AGGREGATES       NEGATIVE FOR MICROSCOPIC COLITIS, ACTIVE INFLAMMATION OR GRANULOMAS.  Biopsied.  Outpatient Encounter Medications as of 09/07/2024  Medication Sig   folic acid  (FOLVITE ) 1 MG tablet Take 1 tablet (1 mg total) by mouth daily.   sulfaSALAzine  (AZULFIDINE ) 500 MG tablet Take 2 tablets (1,000 mg total) by mouth 2 (two) times daily. With folic acid  1 mg daily.   [DISCONTINUED] Budesonide  ER 9 MG TB24 Take 1 tablet (9 mg total) by mouth in the morning.   [DISCONTINUED] rosuvastatin  (CRESTOR ) 20 MG tablet Take 1 tablet (20 mg total) by mouth daily.   No facility-administered encounter medications on file as of 09/07/2024.    Allergies as of 09/07/2024   (No Known Allergies)    Past Medical History:  Diagnosis Date   Abnormal Pap smear 2007   Allergy    Chlamydia 2013   treated   Family history of sickle cell anemia (Hgb SS) in mother    FHx: cancer    Fibroids    Fibroids during pregnancy   H/O varicella    IUD (intrauterine device) in place 08/2010   Pulmonary embolism (HCC)  04/12/2022   Pulmonary embolism associated with estrogen containing oral contraceptives.   Routine adult health maintenance 04/24/2022   Second-degree perineal laceration, with delivery 02/23/2017   Sickle cell trait    SVD (spontaneous vaginal delivery) : waterbirth  02/23/2017   Yeast infection     Past Surgical History:  Procedure Laterality Date   NO PAST SURGERIES     PULMONARY EMBOLISM SURGERY     WISDOM  TOOTH EXTRACTION Bilateral     Family History  Problem Relation Age of Onset   Sickle cell anemia Mother        alive   Colon cancer Father    Sickle cell anemia Maternal Aunt        alive   Sickle cell anemia Maternal Aunt        died   Sickle cell anemia Maternal Uncle        alive   Sickle cell anemia Maternal Uncle        died   Breast cancer Paternal Aunt    Esophageal cancer Neg Hx    Pancreatic cancer Neg Hx    Stomach cancer Neg Hx    Liver disease Neg Hx     Social History   Socioeconomic History   Marital status: Married    Spouse name: Not on file   Number of children: Not on file   Years of education: Not on file   Highest education level: Master's degree (e.g., MA, MS, MEng, MEd, MSW, MBA)  Occupational History   Not on file  Tobacco Use   Smoking status: Never    Passive exposure: Never   Smokeless tobacco: Never  Vaping Use   Vaping status: Never Used  Substance and Sexual Activity   Alcohol use: Yes    Alcohol/week: 0.0 standard drinks of alcohol    Comment: socially    Drug use: No   Sexual activity: Yes    Partners: Male    Birth control/protection: None    Comment: last IC- this morning   Other Topics Concern   Not on file  Social History Narrative   Not on file   Social Drivers of Health   Tobacco Use: Low Risk (09/07/2024)   Patient History    Smoking Tobacco Use: Never    Smokeless Tobacco Use: Never    Passive Exposure: Never  Financial Resource Strain: Low Risk (08/25/2024)   Overall Financial Resource Strain (CARDIA)    Difficulty of Paying Living Expenses: Not hard at all  Food Insecurity: No Food Insecurity (08/25/2024)   Epic    Worried About Programme Researcher, Broadcasting/film/video in the Last Year: Never true    Ran Out of Food in the Last Year: Never true  Transportation Needs: No Transportation Needs (08/25/2024)   Epic    Lack of Transportation (Medical): No    Lack of Transportation (Non-Medical): No  Physical Activity: Insufficiently  Active (08/25/2024)   Exercise Vital Sign    Days of Exercise per Week: 3 days    Minutes of Exercise per Session: 30 min  Stress: No Stress Concern Present (08/25/2024)   [employer] of Occupational Health - Occupational Stress Questionnaire    Feeling of Stress: Only a little  Social Connections: Moderately Integrated (08/25/2024)   Social Connection and Isolation Panel    Frequency of Communication with Friends and Family: Three times a week    Frequency of Social Gatherings with Friends and Family: Never    Attends Religious Services: More than 4  times per year    Active Member of Clubs or Organizations: No    Attends Banker Meetings: Not on file    Marital Status: Married  Intimate Partner Violence: Not on file  Depression (PHQ2-9): High Risk (12/11/2022)   Depression (PHQ2-9)    PHQ-2 Score: 16  Alcohol Screen: Low Risk (08/25/2024)   Alcohol Screen    Last Alcohol Screening Score (AUDIT): 2  Housing: Low Risk (08/25/2024)   Epic    Unable to Pay for Housing in the Last Year: No    Number of Times Moved in the Last Year: 0    Homeless in the Last Year: No  Utilities: Not on file  Health Literacy: Not on file      Review of systems: All other review of systems negative except as mentioned in the HPI.   Physical Exam: Vitals:   09/07/24 1417  BP: 90/60  Pulse: 68   Body mass index is 27.12 kg/m. Gen:      No acute distress HEENT:  sclera anicteric CV: s1s2 rrr, no murmur Lungs: B/l clear. Abd:      soft, non-tender; no palpable masses, no distension Ext:    No edema Neuro: alert and oriented x 3 Psych: normal mood and affect  Data Reviewed:  Reviewed labs, radiology imaging, old records and pertinent past GI work up   Assessment and Plan Assessment & Plan Ulcerative proctitis Ulcerative proctitis is well controlled and asymptomatic on maintenance therapy, with no evidence of active disease or flare. - Recommended continuation of  sulfasalazine  at current dose (two tablets in the morning and two at night). - DC budesonide  as steroid therapy is not indicated. - Provided anticipatory guidance regarding the importance of sulfasalazine  for long-term disease control and colorectal cancer risk reduction. - Scheduled follow-up in one year.  Hemorrhoids Hemorrhoids are intermittently symptomatic, likely exacerbated by prior constipation, with no current active bleeding and improvement following dietary modifications. - Recommended high fiber diet and increased water intake to prevent recurrence. - Discussed that constipation may have contributed to hemorrhoidal irritation and bleeding. - Use topical steroid cream Preparation H small pea-sized amount per rectum twice daily for symptomatic relief as needed. - Advised use of stool softener (Colace/docusate) as needed to prevent constipation.  Constipation Constipation previously present, likely related to dietary changes, now resolved with mild residual backup and no need for aggressive intervention. - Recommended continuation of high fiber diet and adequate hydration. - Advised use of over-the-counter stool softener (Colace/docusate) at bedtime as needed. - Determined colon purge/cleanout was not necessary. - Provided samples of Ibgard (peppermint) for cramping or bloating, with instructions for use as needed. - Encouraged annual blood work to monitor for deficiencies and overall health.  Return in 1 year    This visit required >30 minutes of patient care (this includes precharting, chart review, review of results, face-to-face time used for counseling as well as treatment plan and follow-up. The patient was provided an opportunity to ask questions and all were answered. The patient agreed with the plan and demonstrated an understanding of the instructions.  LOIS Wilkie Mcgee , MD    CC: Norrine Sharper, MD    "

## 2024-09-07 NOTE — Patient Instructions (Addendum)
 VISIT SUMMARY:  Today we discussed your recent changes in bowel habits, including constipation and episodes of urgency, and reviewed your management of ulcerative proctitis, hemorrhoids, and hypercholesterolemia. We also addressed your recent rectal bleeding and menstrual cycle.  YOUR PLAN:  ULCERATIVE PROCTITIS: Your ulcerative proctitis is well controlled with your current medication. -Continue taking sulfasalazine , two tablets in the morning and two at night. -Steroid therapy is not needed at this time. -It is important to continue sulfasalazine  for long-term disease control and to reduce the risk of colorectal cancer. -Follow up in one year.  HEMORRHOIDS: Your hemorrhoids are intermittently symptomatic but have improved with dietary changes. -Maintain a high fiber diet and increase water intake to prevent recurrence. -Use a topical steroid cream preparation H for relief if needed. -Use a stool softener (Colace/docusate) as needed to prevent constipation.  CONSTIPATION: Your constipation has resolved with dietary changes, but mild residual backup may still be present. -Continue a high fiber diet and stay hydrated. -Use an over-the-counter stool softener (Colace/docusate) at bedtime if needed. -A colon purge/cleanout is not necessary. -Use Ibgard (peppermint) for cramping or bloating as needed. -Get annual blood work to monitor for deficiencies and overall health.  We have sent the following medications to your pharmacy for you to pick up at your convenience: Sulfasalazine    I appreciate the  opportunity to care for you  Thank You   Kavitha Nandigam , MD

## 2024-09-08 ENCOUNTER — Encounter: Payer: Self-pay | Admitting: Gastroenterology

## 2024-09-14 ENCOUNTER — Other Ambulatory Visit: Payer: Self-pay | Admitting: Student

## 2024-09-14 DIAGNOSIS — R41840 Attention and concentration deficit: Secondary | ICD-10-CM

## 2024-09-14 MED ORDER — ATORVASTATIN CALCIUM 40 MG PO TABS: 40.0000 mg | ORAL_TABLET | Freq: Every day | ORAL | 2 refills | Status: AC

## 2024-09-14 NOTE — Telephone Encounter (Signed)
 Meds ordered this encounter  Medications   DISCONTD: rosuvastatin  (CRESTOR ) 20 MG tablet    Sig: Take 1 tablet (20 mg total) by mouth daily.    Dispense:  30 tablet    Refill:  11   atorvastatin  (LIPITOR) 40 MG tablet    Sig: Take 1 tablet (40 mg total) by mouth daily.    Dispense:  30 tablet    Refill:  2   Ozell Kung MD 09/14/2024, 2:09 PM

## 2024-09-14 NOTE — Addendum Note (Signed)
 Addended by: NORRINE SHARPER on: 09/14/2024 02:09 PM   Modules accepted: Orders

## 2024-09-22 ENCOUNTER — Institutional Professional Consult (permissible substitution) (HOSPITAL_BASED_OUTPATIENT_CLINIC_OR_DEPARTMENT_OTHER): Admitting: Internal Medicine

## 2024-09-23 ENCOUNTER — Encounter: Admitting: Family Medicine

## 2024-10-06 ENCOUNTER — Encounter: Payer: Self-pay | Admitting: Family Medicine
